# Patient Record
Sex: Male | Born: 1945
Health system: Southern US, Community
[De-identification: ages and names within clinical notes are randomized; demographics above are authoritative.]

## PROBLEM LIST (undated history)

## (undated) DIAGNOSIS — E785 Hyperlipidemia, unspecified: Secondary | ICD-10-CM

## (undated) DIAGNOSIS — T7840XA Allergy, unspecified, initial encounter: Secondary | ICD-10-CM

## (undated) DIAGNOSIS — N400 Enlarged prostate without lower urinary tract symptoms: Secondary | ICD-10-CM

## (undated) DIAGNOSIS — K219 Gastro-esophageal reflux disease without esophagitis: Secondary | ICD-10-CM

## (undated) DIAGNOSIS — F32A Depression, unspecified: Secondary | ICD-10-CM

## (undated) DIAGNOSIS — I10 Essential (primary) hypertension: Secondary | ICD-10-CM

## (undated) DIAGNOSIS — Z86010 Personal history of colonic polyps: Principal | ICD-10-CM

## (undated) HISTORY — PX: INGUINAL HERNIA REPAIR: SUR1180

## (undated) HISTORY — DX: Personal history of colonic polyps: Z86.010

## (undated) HISTORY — PX: ESOPHAGOGASTRODUODENOSCOPY: SHX1529

## (undated) HISTORY — DX: Benign prostatic hyperplasia without lower urinary tract symptoms: N40.0

## (undated) HISTORY — DX: Gastro-esophageal reflux disease without esophagitis: K21.9

## (undated) HISTORY — PX: UPPER GASTROINTESTINAL ENDOSCOPY: SHX188

## (undated) HISTORY — DX: Depression, unspecified: F32.A

## (undated) HISTORY — PX: COLONOSCOPY: SHX174

## (undated) HISTORY — DX: Hyperlipidemia, unspecified: E78.5

## (undated) HISTORY — DX: Allergy, unspecified, initial encounter: T78.40XA

---

## 2013-06-02 DIAGNOSIS — Z125 Encounter for screening for malignant neoplasm of prostate: Secondary | ICD-10-CM | POA: Diagnosis not present

## 2013-06-02 DIAGNOSIS — I10 Essential (primary) hypertension: Secondary | ICD-10-CM | POA: Diagnosis not present

## 2013-06-02 DIAGNOSIS — Z79899 Other long term (current) drug therapy: Secondary | ICD-10-CM | POA: Diagnosis not present

## 2013-06-02 DIAGNOSIS — E782 Mixed hyperlipidemia: Secondary | ICD-10-CM | POA: Diagnosis not present

## 2013-06-04 DIAGNOSIS — Z23 Encounter for immunization: Secondary | ICD-10-CM | POA: Diagnosis not present

## 2013-08-12 DIAGNOSIS — H2510 Age-related nuclear cataract, unspecified eye: Secondary | ICD-10-CM | POA: Diagnosis not present

## 2013-08-12 DIAGNOSIS — H25019 Cortical age-related cataract, unspecified eye: Secondary | ICD-10-CM | POA: Diagnosis not present

## 2013-09-21 DIAGNOSIS — I10 Essential (primary) hypertension: Secondary | ICD-10-CM | POA: Diagnosis not present

## 2013-09-21 DIAGNOSIS — J019 Acute sinusitis, unspecified: Secondary | ICD-10-CM | POA: Diagnosis not present

## 2013-11-11 DIAGNOSIS — I10 Essential (primary) hypertension: Secondary | ICD-10-CM | POA: Diagnosis not present

## 2013-11-11 DIAGNOSIS — E782 Mixed hyperlipidemia: Secondary | ICD-10-CM | POA: Diagnosis not present

## 2013-12-10 DIAGNOSIS — N400 Enlarged prostate without lower urinary tract symptoms: Secondary | ICD-10-CM | POA: Diagnosis not present

## 2014-06-20 DIAGNOSIS — Z23 Encounter for immunization: Secondary | ICD-10-CM | POA: Diagnosis not present

## 2014-06-22 DIAGNOSIS — H2510 Age-related nuclear cataract, unspecified eye: Secondary | ICD-10-CM | POA: Diagnosis not present

## 2014-07-22 ENCOUNTER — Encounter (HOSPITAL_COMMUNITY): Payer: Self-pay | Admitting: Emergency Medicine

## 2014-07-22 ENCOUNTER — Ambulatory Visit (HOSPITAL_COMMUNITY)
Admit: 2014-07-22 | Discharge: 2014-07-22 | Disposition: A | Discharge status: Home / Self Care | Payer: Medicare Other | Source: Ambulatory Visit | Attending: Family Medicine | Admitting: Family Medicine

## 2014-07-22 ENCOUNTER — Emergency Department (INDEPENDENT_AMBULATORY_CARE_PROVIDER_SITE_OTHER)
Admission: EM | Admit: 2014-07-22 | Discharge: 2014-07-22 | Disposition: A | Discharge status: Home / Self Care | Payer: Medicare Other | Source: Home / Self Care | Attending: Family Medicine | Admitting: Family Medicine

## 2014-07-22 DIAGNOSIS — R05 Cough: Secondary | ICD-10-CM | POA: Insufficient documentation

## 2014-07-22 DIAGNOSIS — J309 Allergic rhinitis, unspecified: Secondary | ICD-10-CM

## 2014-07-22 DIAGNOSIS — R0989 Other specified symptoms and signs involving the circulatory and respiratory systems: Secondary | ICD-10-CM | POA: Diagnosis not present

## 2014-07-22 DIAGNOSIS — R059 Cough, unspecified: Secondary | ICD-10-CM

## 2014-07-22 HISTORY — DX: Essential (primary) hypertension: I10

## 2014-07-22 MED ORDER — BENZONATATE 100 MG PO CAPS: 100.0000 mg | ORAL_CAPSULE | Freq: Three times a day (TID) | ORAL | Status: DC

## 2014-07-22 MED ORDER — CETIRIZINE HCL 10 MG PO TABS: 10.0000 mg | ORAL_TABLET | Freq: Every day | ORAL | Status: DC

## 2014-07-22 MED ORDER — OMEPRAZOLE 20 MG PO CPDR: 20.0000 mg | DELAYED_RELEASE_CAPSULE | Freq: Every day | ORAL | Status: DC

## 2014-07-22 MED ORDER — PREDNISONE 10 MG PO TABS: ORAL_TABLET | ORAL | Status: DC

## 2014-07-22 NOTE — ED Notes (Signed)
Pt is back from x-ray

## 2014-07-22 NOTE — ED Notes (Signed)
Patient transported to X-ray 

## 2014-07-22 NOTE — ED Provider Notes (Signed)
Medical screening examination/treatment/procedure(s) were performed by resident physician or non-physician practitioner and as supervising physician I was immediately available for consultation/collaboration.   Pauline Good MD.   Billy Fischer, MD 07/22/14 [phone]

## 2014-07-22 NOTE — ED Notes (Signed)
C/o cold sx onset 2-3 weeks Sx include: productive cough, congestion, SOB, wheezing; sx worsen at night Denies f/v/n/d, weakness, fatigue Has been taking Tussin w/no relief Alert, no signs of acute distress.

## 2014-07-22 NOTE — ED Provider Notes (Signed)
CSN: [id number]     Arrival date & time 07/22/14  1105 History   First MD Initiated Contact with Patient 07/22/14 1212     Chief Complaint  Patient presents with  . URI   (Consider location/radiation/quality/duration/timing/severity/associated sxs/prior Treatment) HPI Comments: Patient has recently relocated to [facility] from [place], [place] and is not schedule to see his new PCP for another 4-6 weeks.  PCP: Dr. Joylene Draft Non-smoker Denies fever, chills, dyspnea, weight loss, hemoptysis or night sweats.   Patient is a 68 y.o. male presenting with URI. The history is provided by the patient.  URI Presenting symptoms: congestion, cough and rhinorrhea   Presenting symptoms: no fever   Severity:  Moderate Onset quality:  Gradual Duration:  3 weeks Timing:  Constant Chronicity:  New Associated symptoms: no arthralgias, no headaches, no myalgias, no neck pain, no sinus pain, no sneezing, no swollen glands and no wheezing   Risk factors: no sick contacts     Past Medical History  Diagnosis Date  . Hypertension    History reviewed. No pertinent past surgical history. No family history on file. History  Substance Use Topics  . Smoking status: Never Smoker   . Smokeless tobacco: Not on file  . Alcohol Use: Yes    Review of Systems  Constitutional: Negative for fever.  HENT: Positive for congestion and rhinorrhea. Negative for sneezing.   Respiratory: Positive for cough. Negative for wheezing.   Musculoskeletal: Negative for arthralgias, myalgias and neck pain.  Neurological: Negative for headaches.  All other systems reviewed and are negative.   Allergies  Review of patient's allergies indicates no known allergies.  Home Medications   Prior to Admission medications   Medication Sig Start Date End Date Taking? Authorizing Provider  aspirin 81 MG tablet Take 81 mg by mouth daily.   Yes Historical Provider, MD  atorvastatin (LIPITOR) 20 MG tablet Take 20 mg by mouth daily.    Yes Historical Provider, MD  metoprolol tartrate (LOPRESSOR) 25 MG tablet Take 25 mg by mouth 2 (two) times daily.   Yes Historical Provider, MD  ramipril (ALTACE) 2.5 MG capsule Take 2.5 mg by mouth daily.   Yes Historical Provider, MD  benzonatate (TESSALON) 100 MG capsule Take 1 capsule (100 mg total) by mouth every 8 (eight) hours. 07/22/14   Audelia Hives Presson, PA  cetirizine (ZYRTEC) 10 MG tablet Take 1 tablet (10 mg total) by mouth daily. 07/22/14   Audelia Hives Presson, PA  predniSONE (DELTASONE) 10 MG tablet Take 4 tabs po QD day 1, 3 tabs po QD day 2, 2 tabs po QD day 3, and 1 tab po QD day 4 then stop 07/22/14   Annett Gula H Presson, PA   BP 135/75  Pulse 71  Temp(Src) 98.3 F (36.8 C) (Oral)  Resp 16  SpO2 100% Physical Exam  Nursing note and vitals reviewed. Constitutional: He is oriented to person, place, and time. He appears well-developed and well-nourished. No distress.  HENT:  Head: Normocephalic and atraumatic.  Right Ear: Hearing, tympanic membrane, external ear and ear canal normal.  Left Ear: Hearing, tympanic membrane, external ear and ear canal normal.  Nose: Nose normal.  Mouth/Throat: Uvula is midline, oropharynx is clear and moist and mucous membranes are normal.  Eyes: Conjunctivae are normal. No scleral icterus.  Neck: Normal range of motion. Neck supple.  Cardiovascular: Normal rate, regular rhythm and normal heart sounds.   Pulmonary/Chest: Effort normal and breath sounds normal. No respiratory distress. He  has no wheezes.  Abdominal: Soft. Bowel sounds are normal. He exhibits no distension. There is no tenderness.  Musculoskeletal: Normal range of motion.  Lymphadenopathy:    He has no cervical adenopathy.  Neurological: He is alert and oriented to person, place, and time.  Skin: Skin is warm and dry. No rash noted. No erythema.  Psychiatric: He has a normal mood and affect. His behavior is normal.    ED Course  Procedures (including  critical care time) Labs Review Labs Reviewed - No data to display  Imaging Review Dg Chest 2 View  07/22/2014   CLINICAL DATA:  Congestion, productive cough for 3 weeks  EXAM: CHEST  2 VIEW  COMPARISON:  None.  FINDINGS: No active infiltrate or effusion is seen. Mediastinal and hilar contours are unremarkable. The heart is within normal limits in size. No bony abnormality is seen.  IMPRESSION: No active cardiopulmonary disease.   Electronically Signed   By: Ivar Drape M.D.   On: 07/22/2014 14:12     MDM   1. Cough   2. Allergic rhinitis, unspecified allergic rhinitis type    CXR unremarkable. Patient does take daily ACEI and I question if this medication is at all responsible for his cough. Patient does indicate a history of environmental allergies and given associated symptoms of nasal congestion and rhinorrhea, may benefit from medication to help control these symptoms in hope of improving cough. Will Rx tessalon, short prednisone taper, and daily zyrtec and advise PCP follow up if no improvement.    Lutricia Feil, [place] 07/22/14 [phone]

## 2014-07-22 NOTE — Discharge Instructions (Signed)
Chest xray is normal. I suspect this is related to seasonal allergies. Please use medications as prescribed and follow up with Dr. Joylene Draft if no improvement.  Allergic Rhinitis Allergic rhinitis is when the mucous membranes in the nose respond to allergens. Allergens are particles in the air that cause your body to have an allergic reaction. This causes you to release allergic antibodies. Through a chain of events, these eventually cause you to release histamine into the blood stream. Although meant to protect the body, it is this release of histamine that causes your discomfort, such as frequent sneezing, congestion, and an itchy, runny nose.  CAUSES  Seasonal allergic rhinitis (hay fever) is caused by pollen allergens that may come from grasses, trees, and weeds. Year-round allergic rhinitis (perennial allergic rhinitis) is caused by allergens such as house dust mites, pet dander, and mold spores.  SYMPTOMS   Nasal stuffiness (congestion).  Itchy, runny nose with sneezing and tearing of the eyes. DIAGNOSIS  Your health care provider can help you determine the allergen or allergens that trigger your symptoms. If you and your health care provider are unable to determine the allergen, skin or blood testing may be used. TREATMENT  Allergic rhinitis does not have a cure, but it can be controlled by:  Medicines and allergy shots (immunotherapy).  Avoiding the allergen. Hay fever may often be treated with antihistamines in pill or nasal spray forms. Antihistamines block the effects of histamine. There are over-the-counter medicines that may help with nasal congestion and swelling around the eyes. Check with your health care provider before taking or giving this medicine.  If avoiding the allergen or the medicine prescribed do not work, there are many new medicines your health care provider can prescribe. Stronger medicine may be used if initial measures are ineffective. Desensitizing injections can be  used if medicine and avoidance does not work. Desensitization is when a patient is given ongoing shots until the body becomes less sensitive to the allergen. Make sure you follow up with your health care provider if problems continue. HOME CARE INSTRUCTIONS It is not possible to completely avoid allergens, but you can reduce your symptoms by taking steps to limit your exposure to them. It helps to know exactly what you are allergic to so that you can avoid your specific triggers. SEEK MEDICAL CARE IF:   You have a fever.  You develop a cough that does not stop easily (persistent).  You have shortness of breath.  You start wheezing.  Symptoms interfere with normal daily activities. Document Released: 06/18/2001 Document Revised: 09/28/2013 Document Reviewed: 05/31/2013 [facility] Patient Information 2015 [place], [place]. This information is not intended to replace advice given to you by your health care provider. Make sure you discuss any questions you have with your health care provider.  Cough, Adult  A cough is a reflex that helps clear your throat and airways. It can help heal the body or may be a reaction to an irritated airway. A cough may only last 2 or 3 weeks (acute) or may last more than 8 weeks (chronic).  CAUSES Acute cough:  Viral or bacterial infections. Chronic cough:  Infections.  Allergies.  Asthma.  Post-nasal drip.  Smoking.  Heartburn or acid reflux.  Some medicines.  Chronic lung problems (COPD).  Cancer. SYMPTOMS   Cough.  Fever.  Chest pain.  Increased breathing rate.  High-pitched whistling sound when breathing (wheezing).  Colored mucus that you cough up (sputum). TREATMENT   A bacterial cough may be  treated with antibiotic medicine.  A viral cough must run its course and will not respond to antibiotics.  Your caregiver may recommend other treatments if you have a chronic cough. HOME CARE INSTRUCTIONS   Only take over-the-counter  or prescription medicines for pain, discomfort, or fever as directed by your caregiver. Use cough suppressants only as directed by your caregiver.  Use a cold steam vaporizer or humidifier in your bedroom or home to help loosen secretions.  Sleep in a semi-upright position if your cough is worse at night.  Rest as needed.  Stop smoking if you smoke. SEEK IMMEDIATE MEDICAL CARE IF:   You have pus in your sputum.  Your cough starts to worsen.  You cannot control your cough with suppressants and are losing sleep.  You begin coughing up blood.  You have difficulty breathing.  You develop pain which is getting worse or is uncontrolled with medicine.  You have a fever. MAKE SURE YOU:   Understand these instructions.  Will watch your condition.  Will get help right away if you are not doing well or get worse. Document Released: 03/22/2011 Document Revised: 12/16/2011 Document Reviewed: 03/22/2011 [facility] Patient Information 2015 [place], [place]. This information is not intended to replace advice given to you by your health care provider. Make sure you discuss any questions you have with your health care provider.

## 2014-08-02 DIAGNOSIS — R05 Cough: Secondary | ICD-10-CM | POA: Diagnosis not present

## 2014-08-02 DIAGNOSIS — E785 Hyperlipidemia, unspecified: Secondary | ICD-10-CM | POA: Diagnosis not present

## 2014-08-02 DIAGNOSIS — D126 Benign neoplasm of colon, unspecified: Secondary | ICD-10-CM | POA: Diagnosis not present

## 2014-08-02 DIAGNOSIS — G43109 Migraine with aura, not intractable, without status migrainosus: Secondary | ICD-10-CM | POA: Diagnosis not present

## 2014-08-02 DIAGNOSIS — K219 Gastro-esophageal reflux disease without esophagitis: Secondary | ICD-10-CM | POA: Diagnosis not present

## 2014-08-02 DIAGNOSIS — Z23 Encounter for immunization: Secondary | ICD-10-CM | POA: Diagnosis not present

## 2014-08-02 DIAGNOSIS — I10 Essential (primary) hypertension: Secondary | ICD-10-CM | POA: Diagnosis not present

## 2014-08-02 DIAGNOSIS — L409 Psoriasis, unspecified: Secondary | ICD-10-CM | POA: Diagnosis not present

## 2014-08-02 DIAGNOSIS — Z1389 Encounter for screening for other disorder: Secondary | ICD-10-CM | POA: Diagnosis not present

## 2014-08-02 DIAGNOSIS — Z008 Encounter for other general examination: Secondary | ICD-10-CM | POA: Diagnosis not present

## 2014-08-25 DIAGNOSIS — Z008 Encounter for other general examination: Secondary | ICD-10-CM | POA: Diagnosis not present

## 2014-08-25 DIAGNOSIS — I10 Essential (primary) hypertension: Secondary | ICD-10-CM | POA: Diagnosis not present

## 2014-08-25 DIAGNOSIS — Z125 Encounter for screening for malignant neoplasm of prostate: Secondary | ICD-10-CM | POA: Diagnosis not present

## 2014-08-25 DIAGNOSIS — E785 Hyperlipidemia, unspecified: Secondary | ICD-10-CM | POA: Diagnosis not present

## 2014-10-03 DIAGNOSIS — N401 Enlarged prostate with lower urinary tract symptoms: Secondary | ICD-10-CM | POA: Diagnosis not present

## 2014-10-31 ENCOUNTER — Ambulatory Visit (HOSPITAL_COMMUNITY)
Admission: RE | Admit: 2014-10-31 | Discharge: 2014-10-31 | Disposition: A | Discharge status: Home / Self Care | Payer: Medicare Other | Source: Ambulatory Visit | Attending: Surgery | Admitting: Surgery

## 2014-10-31 ENCOUNTER — Other Ambulatory Visit (HOSPITAL_COMMUNITY): Payer: Self-pay | Admitting: Internal Medicine

## 2014-10-31 DIAGNOSIS — M25561 Pain in right knee: Secondary | ICD-10-CM | POA: Diagnosis present

## 2014-10-31 DIAGNOSIS — Z6826 Body mass index (BMI) 26.0-26.9, adult: Secondary | ICD-10-CM | POA: Diagnosis not present

## 2014-12-15 DIAGNOSIS — Z6826 Body mass index (BMI) 26.0-26.9, adult: Secondary | ICD-10-CM | POA: Diagnosis not present

## 2014-12-15 DIAGNOSIS — R509 Fever, unspecified: Secondary | ICD-10-CM | POA: Diagnosis not present

## 2014-12-15 DIAGNOSIS — R05 Cough: Secondary | ICD-10-CM | POA: Diagnosis not present

## 2014-12-15 DIAGNOSIS — J09X2 Influenza due to identified novel influenza A virus with other respiratory manifestations: Secondary | ICD-10-CM | POA: Diagnosis not present

## 2015-01-02 DIAGNOSIS — N400 Enlarged prostate without lower urinary tract symptoms: Secondary | ICD-10-CM | POA: Diagnosis not present

## 2015-01-02 DIAGNOSIS — R972 Elevated prostate specific antigen [PSA]: Secondary | ICD-10-CM | POA: Diagnosis not present

## 2015-01-03 DIAGNOSIS — N400 Enlarged prostate without lower urinary tract symptoms: Secondary | ICD-10-CM | POA: Diagnosis not present

## 2015-01-03 DIAGNOSIS — R972 Elevated prostate specific antigen [PSA]: Secondary | ICD-10-CM | POA: Diagnosis not present

## 2015-01-04 DIAGNOSIS — R972 Elevated prostate specific antigen [PSA]: Secondary | ICD-10-CM | POA: Diagnosis not present

## 2015-01-04 DIAGNOSIS — N400 Enlarged prostate without lower urinary tract symptoms: Secondary | ICD-10-CM | POA: Diagnosis not present

## 2015-02-01 ENCOUNTER — Encounter: Payer: Self-pay | Admitting: Internal Medicine

## 2015-03-13 ENCOUNTER — Telehealth: Payer: Self-pay | Admitting: Internal Medicine

## 2015-03-13 NOTE — Telephone Encounter (Signed)
Received records from Dr. [facility] forwarded to Dr.Gessner 03/13/15 fbg.

## 2015-03-22 ENCOUNTER — Encounter: Payer: Self-pay | Admitting: Internal Medicine

## 2015-03-22 ENCOUNTER — Ambulatory Visit (INDEPENDENT_AMBULATORY_CARE_PROVIDER_SITE_OTHER): Payer: Medicare Other | Admitting: Internal Medicine

## 2015-03-22 ENCOUNTER — Encounter (INDEPENDENT_AMBULATORY_CARE_PROVIDER_SITE_OTHER): Payer: Self-pay

## 2015-03-22 VITALS — BP 138/76 | HR 84 | Ht 65.75 in | Wt 162.5 lb

## 2015-03-22 DIAGNOSIS — R1314 Dysphagia, pharyngoesophageal phase: Secondary | ICD-10-CM | POA: Diagnosis not present

## 2015-03-22 DIAGNOSIS — K222 Esophageal obstruction: Secondary | ICD-10-CM | POA: Diagnosis not present

## 2015-03-22 DIAGNOSIS — R1319 Other dysphagia: Secondary | ICD-10-CM

## 2015-03-22 DIAGNOSIS — Z86010 Personal history of colonic polyps: Secondary | ICD-10-CM | POA: Diagnosis not present

## 2015-03-22 DIAGNOSIS — R1310 Dysphagia, unspecified: Secondary | ICD-10-CM

## 2015-03-22 NOTE — Progress Notes (Signed)
Referred by: Crist Infante, MD [place], [place] [id number]  Subjective:    Patient ID: Colin Garrett, male    DOB: May 28, 1946, 69 y.o.   MRN: [id number] Cc: dysphagia and hx colon polyps HPI This is a 69 yo wm with a hx of colon polyps and esophageal ring. He is having intermittent solid dysphagia. Has used PPI in past but not now and says only rare heartburn. He had an EGD with dilation in 2002 and EGD 2010 no dilation.  Colonoscopy 2008 3 small polyps removed ascending x 1 and 2 left side, diverticulosis and hemorrhoids. No pathology but says he was told the polyps were precancerous.  He says he gets an occasional twinge of chest pain. Hx some intermittent rectal bleeding. GI ROS o/w negative.  No Known Allergies Outpatient Prescriptions Prior to Visit  Medication Sig Dispense Refill  . aspirin 81 MG tablet Take 81 mg by mouth daily.    . cetirizine (ZYRTEC) 10 MG tablet Take 1 tablet (10 mg total) by mouth daily. 30 tablet 0  . ramipril (ALTACE) 2.5 MG capsule Take 2.5 mg by mouth daily.    Marland Kitchen atorvastatin (LIPITOR) 20 MG tablet Take 20 mg by mouth daily.    . benzonatate (TESSALON) 100 MG capsule Take 1 capsule (100 mg total) by mouth every 8 (eight) hours. 21 capsule 0  . metoprolol tartrate (LOPRESSOR) 25 MG tablet Take 25 mg by mouth 2 (two) times daily.    Marland Kitchen omeprazole (PRILOSEC) 20 MG capsule Take 1 capsule (20 mg total) by mouth daily. 30 capsule 0  . predniSONE (DELTASONE) 10 MG tablet Take 4 tabs po QD day 1, 3 tabs po QD day 2, 2 tabs po QD day 3, and 1 tab po QD day 4 then stop 10 tablet 0   No facility-administered medications prior to visit.   Past Medical History  Diagnosis Date  . Hypertension   . Hyperlipidemia   . Hyperplasia of prostate    Past Surgical History  Procedure Laterality Date  . Inguinal hernia repair Left   . [place]og[place]oscopy      in [place]  . [place]oscopy      in [place]   History   Social History  . Marital Status: Married   Spouse Name: N/A  . Number of Children: 4  . Years of Education: N/A   Occupational History  . management    Social History Main Topics  . Smoking status: Former Smoker -- 1.00 packs/day for 30 years    Types: Cigars, Cigarettes    Quit date: 10/07/2004  . Smokeless tobacco: Never Used  . Alcohol Use: 0.0 oz/week    0 Standard drinks or equivalent per week     Comment: Pt drinks 1-2 drinks a day  . Drug Use: No  . Sexual Activity: Not on file   Other Topics Concern  . None   Social History Narrative   Married 2 sons 2 daughters   Arts development officer, Chief Strategy Officer   Originally from [place], in [place] since 2013-4, prior to that split time in [place] and [place]   1 caffeine drink daily   03/23/2015      Family History  Problem Relation Age of Onset  . Prostate cancer Brother 91  . Heart disease Mother     Died at age 57  . Heart disease Father     Died at age 33  . Colon cancer Neg Hx   . Colon polyps Neg Hx   .  Esophageal cancer Neg Hx   . Gallbladder disease Neg Hx   . Kidney disease Neg Hx     Review of Systems All other ROS negative or as in HPI    Objective:   Physical Exam @BP  138/76 mmHg  Pulse 84  Ht 5' 5.75" (1.67 m)  Wt 162 lb 8 oz (73.71 kg)  BMI 26.43 kg/m2@  General:  Well-developed, well-nourished and in no acute distress Eyes:  anicteric. Lungs: Clear to auscultation bilaterally. Heart:  S1S2, no rubs, murmurs, gallops. Abdomen:  soft, non-tender, no hepatosplenomegaly, hernia, or mass and BS+.  Rectal: deferred Extremities:   cyanosis or clubbing Neuro:  A&O x 3.  Psych:  appropriate mood and  Affect.   Data Reviewed: Prior EGD, colonoscopy reports and photos     Assessment & Plan:  Esophageal dysphagia  Esophageal ring, acquired  Hx of colonic polyps  1. Schedule EGD and dilation 2. Schedule colonoscopy due to hx polyps 3. Likely to need chronic PPI The risks and benefits as well as alternatives of endoscopic  procedure(s) have been discussed and reviewed. All questions answered. The patient agrees to proceed.  I appreciate the opportunity to care for this patient. HY:IFOYDX,AJOI A, MD

## 2015-03-22 NOTE — Patient Instructions (Signed)
You have been scheduled for a colonoscopy. Please follow written instructions given to you at your visit today.  Please pick up your prep supplies at the pharmacy within the next 1-3 days. If you use inhalers (even only as needed), please bring them with you on the day of your procedure.   I appreciate the opportunity to care for you. Carl Gessner, MD, FACG 

## 2015-03-23 ENCOUNTER — Encounter: Payer: Self-pay | Admitting: Internal Medicine

## 2015-04-07 ENCOUNTER — Telehealth: Payer: Self-pay | Admitting: Internal Medicine

## 2015-04-07 NOTE — Telephone Encounter (Signed)
Rec'd from [facility] forward 33 pages to Dr. Carlean Purl

## 2015-04-11 ENCOUNTER — Telehealth: Payer: Self-pay | Admitting: Internal Medicine

## 2015-04-11 NOTE — Telephone Encounter (Signed)
Received records from [facility] forwarded to Dr. Carlean Purl 04/11/15 fbg.

## 2015-05-04 ENCOUNTER — Encounter: Payer: Self-pay | Admitting: Internal Medicine

## 2015-05-04 ENCOUNTER — Ambulatory Visit (AMBULATORY_SURGERY_CENTER): Payer: Medicare Other | Admitting: Internal Medicine

## 2015-05-04 VITALS — BP 98/63 | HR 68 | Temp 97.1°F | Resp 14 | Ht 65.0 in | Wt 162.0 lb

## 2015-05-04 DIAGNOSIS — I10 Essential (primary) hypertension: Secondary | ICD-10-CM | POA: Diagnosis not present

## 2015-05-04 DIAGNOSIS — R1319 Other dysphagia: Secondary | ICD-10-CM

## 2015-05-04 DIAGNOSIS — Z86010 Personal history of colonic polyps: Secondary | ICD-10-CM | POA: Diagnosis not present

## 2015-05-04 DIAGNOSIS — D175 Benign lipomatous neoplasm of intra-abdominal organs: Secondary | ICD-10-CM | POA: Diagnosis not present

## 2015-05-04 DIAGNOSIS — R1310 Dysphagia, unspecified: Secondary | ICD-10-CM

## 2015-05-04 DIAGNOSIS — K222 Esophageal obstruction: Secondary | ICD-10-CM

## 2015-05-04 DIAGNOSIS — D122 Benign neoplasm of ascending colon: Secondary | ICD-10-CM

## 2015-05-04 DIAGNOSIS — R1314 Dysphagia, pharyngoesophageal phase: Secondary | ICD-10-CM | POA: Diagnosis not present

## 2015-05-04 DIAGNOSIS — D124 Benign neoplasm of descending colon: Secondary | ICD-10-CM

## 2015-05-04 DIAGNOSIS — K635 Polyp of colon: Secondary | ICD-10-CM

## 2015-05-04 DIAGNOSIS — E785 Hyperlipidemia, unspecified: Secondary | ICD-10-CM | POA: Diagnosis not present

## 2015-05-04 MED ORDER — SODIUM CHLORIDE 0.9 % IV SOLN: 500.0000 mL | INTRAVENOUS | Status: DC

## 2015-05-04 MED ORDER — OMEPRAZOLE 20 MG PO CPDR: 20.0000 mg | DELAYED_RELEASE_CAPSULE | Freq: Every day | ORAL | Status: DC

## 2015-05-04 NOTE — Op Note (Signed)
[place]  [address] [place], [id number]   ENDOSCOPY PROCEDURE REPORT  PATIENT: Colin Garrett, Colin Garrett  MR#: [id number] BIRTHDATE: 12-Feb-1946 , 69  yrs. old GENDER: male ENDOSCOPIST: Gatha Mayer, MD, [facility] PROCEDURE DATE:  05/04/2015 PROCEDURE:  EGD w/ balloon dilation ASA CLASS:     Class II INDICATIONS:  dysphagia. MEDICATIONS: Propofol 200 mg IV and Monitored anesthesia care TOPICAL ANESTHETIC: none  DESCRIPTION OF PROCEDURE: After the risks benefits and alternatives of the procedure were thoroughly explained, informed consent was obtained.  The LB [id number] endoscope was introduced through the mouth and advanced to the second portion of the duodenum , Without limitations.  The instrument was slowly withdrawn as the mucosa was fully examined.    1) ring-like stricture at GE junction - dilated to 18 mm w/ balloon. 2) 2-3 cm hiatal hernia 3) Otherwise normal EGD.  Retroflexed views revealed as previously described.     The scope was then withdrawn from the patient and the procedure completed.  COMPLICATIONS: There were no immediate complications.  ENDOSCOPIC IMPRESSION: 1) ring-like stricture at GE junction - dilated to 18 mm w/ balloon. 2) 2-3 cm hiatal hernia 3) Otherwise normal EGD  RECOMMENDATIONS: 1.  Clear liquids until 430 PM, then soft foods rest of day.  Resume prior diet tomorrow. 2.  PPI qam  - omeprazole 20 mg Rx - take chronically to reduce need for repeat dilation   eSigned:  Gatha Mayer, MD, [facility] 05/04/2015 3:23 PM    CC:The Patient and Crist Infante, MD

## 2015-05-04 NOTE — Op Note (Signed)
[place]  [address] [place], [address]   COLONOSCOPY PROCEDURE REPORT  PATIENT: Colin Garrett, Colin Garrett  MR#: [id number] BIRTHDATE: 08-20-1946 , 69  yrs. old GENDER: male ENDOSCOPIST: Gatha Mayer, MD, [facility] PROCEDURE DATE:  05/04/2015 PROCEDURE:   Colonoscopy, surveillance and Colonoscopy with biopsy First Screening Colonoscopy - Avg.  risk and is 50 yrs.  old or older - No.  Prior Negative Screening - Now for repeat screening. N/A  History of Adenoma - Now for follow-up colonoscopy & has been > or = to 3 yrs.  Yes hx of adenoma.  Has been 3 or more years since last colonoscopy.  Polyps removed today? Yes ASA CLASS:   Class II INDICATIONS:Surveillance due to prior colonic neoplasia and PH Colon Adenoma. MEDICATIONS: Propofol 140 mg IV and Monitored anesthesia care  DESCRIPTION OF PROCEDURE:   After the risks benefits and alternatives of the procedure were thoroughly explained, informed consent was obtained.  The digital rectal exam revealed no abnormalities of the rectum, revealed no prostatic nodules, and revealed the prostate was not enlarged.   The LB [id number] endoscope was introduced through the anus and advanced to the cecum, which was identified by both the appendix and ileocecal valve. No adverse events experienced.   The quality of the prep was good.  (MiraLax was used)  The instrument was then slowly withdrawn as the colon was fully examined. Estimated blood loss is zero unless otherwise noted in this procedure report.  COLON FINDINGS: Two smooth sessile polyps ranging from 2 to 54mm in size were found in the ascending colon and descending colon. Polypectomies were performed with cold forceps.  The resection was complete, the polyp tissue was completely retrieved and sent to histology.   There was diverticulosis noted in the sigmoid colon. The examination was otherwise normal.   Right colon retroflexion included.  Retroflexed views revealed no  abnormalities. The time to cecum = 2.1 Withdrawal time = 10.4   The scope was withdrawn and the procedure completed. COMPLICATIONS: There were no immediate complications.  ENDOSCOPIC IMPRESSION: 1.   Two sessile polyps ranging from 2 to 91mm in size were found in the ascending colon and descending colon; polypectomies were performed with cold forceps 2.   Diverticulosis was noted in the sigmoid colon 3.   The examination was otherwise normal  RECOMMENDATIONS: Timing of repeat colonoscopy will be determined by pathology findings.  Hx of diminutive polyps removed in past  eSigned:  Gatha Mayer, MD, [facility] 05/04/2015 3:27 PM   cc: Crist Infante, MD and The Patient

## 2015-05-04 NOTE — Progress Notes (Signed)
Called to room to assist during endoscopic procedure.  Patient ID and intended procedure confirmed with present staff. Received instructions for my participation in the procedure from the performing physician.  

## 2015-05-04 NOTE — Progress Notes (Signed)
Report to PACU, RN, vss, BBS= Clear.  

## 2015-05-04 NOTE — Patient Instructions (Addendum)
I found a stricture in the esophagus and dilated it to treat your swallowing problems. This is usually caused by acid reflux even if you do not have heartburn so i have prescribed omeprazole to treat that and reduce chance of recurrent stricture formation.  I found and removed 2 small polyps from the colon - they look benign. You also have a condition called diverticulosis - common and not usually a problem. Please read the handout provided.  I will let you know pathology results and when to have another routine colonoscopy by mail.  I appreciate the opportunity to care for you. Gatha Mayer, MD, FACG YOU HAD AN ENDOSCOPIC PROCEDURE TODAY AT [facility]:   Refer to the procedure report that was given to you for any specific questions about what was found during the examination.  If the procedure report does not answer your questions, please call your gastroenterologist to clarify.  If you requested that your care partner not be given the details of your procedure findings, then the procedure report has been included in a sealed envelope for you to review at your convenience later.  YOU SHOULD EXPECT: Some feelings of bloating in the abdomen. Passage of more gas than usual.  Walking can help get rid of the air that was put into your GI tract during the procedure and reduce the bloating. If you had a lower endoscopy (such as a colonoscopy or flexible sigmoidoscopy) you may notice spotting of blood in your stool or on the toilet paper. If you underwent a bowel prep for your procedure, you may not have a normal bowel movement for a few days.  Please Note:  You might notice some irritation and congestion in your nose or some drainage.  This is from the oxygen used during your procedure.  There is no need for concern and it should clear up in a day or so.  SYMPTOMS TO REPORT IMMEDIATELY:   Following lower endoscopy (colonoscopy or flexible sigmoidoscopy):  Excessive amounts  of blood in the stool  Significant tenderness or worsening of abdominal pains  Swelling of the abdomen that is new, acute  Fever of 100F or higher   Following upper endoscopy (EGD)  Vomiting of blood or coffee ground material  New chest pain or pain under the shoulder blades  Painful or persistently difficult swallowing  New shortness of breath  Fever of 100F or higher  Black, tarry-looking stools  For urgent or emergent issues, a gastroenterologist can be reached at any hour by calling [phone].   DIET: You may have clear liquids until 4:30pm.  Then you may have a soft diet until tomorrow.  Then, you may have a regular diet.  Likewise, meals heavy in dairy and vegetables can increase bloating.  Drink plenty of fluids but you should avoid alcoholic beverages for 24 hours.  ACTIVITY:  You should plan to take it easy for the rest of today and you should NOT DRIVE or use heavy machinery until tomorrow (because of the sedation medicines used during the test).    FOLLOW UP: Our staff will call the number listed on your records the next business day following your procedure to check on you and address any questions or concerns that you may have regarding the information given to you following your procedure. If we do not reach you, we will leave a message.  However, if you are feeling well and you are not experiencing any problems, there is no need to  return our call.  We will assume that you have returned to your regular daily activities without incident.  If any biopsies were taken you will be contacted by phone or by letter within the next 1-3 weeks.  Please call us at [phone] if you have not heard about the biopsies in 3 weeks.    SIGNATURES/CONFIDENTIALITY: You and/or your care partner have signed paperwork which will be entered into your electronic medical record.  These signatures attest to the fact that that the information above on your After Visit Summary has been  reviewed and is understood.  Full responsibility of the confidentiality of this discharge information lies with you and/or your care-partner.  Read all of the handouts given to you by your recovery room nurse.

## 2015-05-05 ENCOUNTER — Telehealth: Payer: Self-pay

## 2015-05-05 NOTE — Telephone Encounter (Signed)
  Follow up Call-  Call back number 05/04/2015  Post procedure Call Back phone  # [phone]  Permission to leave phone message Yes     Patient questions:  Do you have a fever, pain , or abdominal swelling? No. Pain Score  0 *  Have you tolerated food without any problems? Yes.    Have you been able to return to your normal activities? Yes.    Do you have any questions about your discharge instructions: Diet   No. Medications  No. Follow up visit  No.  Do you have questions or concerns about your Care? No.  Actions: * If pain score is 4 or above: No action needed, pain <4.

## 2015-05-15 ENCOUNTER — Encounter: Payer: Self-pay | Admitting: Internal Medicine

## 2015-05-15 DIAGNOSIS — Z860101 Personal history of adenomatous and serrated colon polyps: Secondary | ICD-10-CM

## 2015-05-15 DIAGNOSIS — Z86010 Personal history of colonic polyps: Secondary | ICD-10-CM

## 2015-05-15 HISTORY — DX: Personal history of adenomatous and serrated colon polyps: Z86.0101

## 2015-05-15 HISTORY — DX: Personal history of colonic polyps: Z86.010

## 2015-05-15 NOTE — Progress Notes (Signed)
Quick Note:  Diminutive adenoma and a diminutive lipoma repeat colonoscopy 5 years 2021 ______

## 2015-06-29 DIAGNOSIS — H2513 Age-related nuclear cataract, bilateral: Secondary | ICD-10-CM | POA: Diagnosis not present

## 2015-07-15 DIAGNOSIS — Z23 Encounter for immunization: Secondary | ICD-10-CM | POA: Diagnosis not present

## 2015-09-18 DIAGNOSIS — Z125 Encounter for screening for malignant neoplasm of prostate: Secondary | ICD-10-CM | POA: Diagnosis not present

## 2015-09-18 DIAGNOSIS — I10 Essential (primary) hypertension: Secondary | ICD-10-CM | POA: Diagnosis not present

## 2015-09-18 DIAGNOSIS — E784 Other hyperlipidemia: Secondary | ICD-10-CM | POA: Diagnosis not present

## 2015-09-25 DIAGNOSIS — I10 Essential (primary) hypertension: Secondary | ICD-10-CM | POA: Diagnosis not present

## 2015-09-25 DIAGNOSIS — D126 Benign neoplasm of colon, unspecified: Secondary | ICD-10-CM | POA: Diagnosis not present

## 2015-09-25 DIAGNOSIS — G43109 Migraine with aura, not intractable, without status migrainosus: Secondary | ICD-10-CM | POA: Diagnosis not present

## 2015-09-25 DIAGNOSIS — E784 Other hyperlipidemia: Secondary | ICD-10-CM | POA: Diagnosis not present

## 2015-09-25 DIAGNOSIS — Z6827 Body mass index (BMI) 27.0-27.9, adult: Secondary | ICD-10-CM | POA: Diagnosis not present

## 2015-09-25 DIAGNOSIS — Z0000 Encounter for general adult medical examination without abnormal findings: Secondary | ICD-10-CM | POA: Diagnosis not present

## 2015-09-25 DIAGNOSIS — N401 Enlarged prostate with lower urinary tract symptoms: Secondary | ICD-10-CM | POA: Diagnosis not present

## 2015-09-25 DIAGNOSIS — M25561 Pain in right knee: Secondary | ICD-10-CM | POA: Diagnosis not present

## 2015-09-25 DIAGNOSIS — Z1389 Encounter for screening for other disorder: Secondary | ICD-10-CM | POA: Diagnosis not present

## 2015-09-25 DIAGNOSIS — K219 Gastro-esophageal reflux disease without esophagitis: Secondary | ICD-10-CM | POA: Diagnosis not present

## 2015-09-25 DIAGNOSIS — R6882 Decreased libido: Secondary | ICD-10-CM | POA: Diagnosis not present

## 2015-09-25 DIAGNOSIS — E291 Testicular hypofunction: Secondary | ICD-10-CM | POA: Diagnosis not present

## 2015-09-25 DIAGNOSIS — L409 Psoriasis, unspecified: Secondary | ICD-10-CM | POA: Diagnosis not present

## 2015-12-13 DIAGNOSIS — L400 Psoriasis vulgaris: Secondary | ICD-10-CM | POA: Diagnosis not present

## 2016-01-01 DIAGNOSIS — R52 Pain, unspecified: Secondary | ICD-10-CM | POA: Diagnosis not present

## 2016-01-01 DIAGNOSIS — J209 Acute bronchitis, unspecified: Secondary | ICD-10-CM | POA: Diagnosis not present

## 2016-01-01 DIAGNOSIS — Z6827 Body mass index (BMI) 27.0-27.9, adult: Secondary | ICD-10-CM | POA: Diagnosis not present

## 2016-01-01 DIAGNOSIS — R509 Fever, unspecified: Secondary | ICD-10-CM | POA: Diagnosis not present

## 2016-01-01 DIAGNOSIS — K219 Gastro-esophageal reflux disease without esophagitis: Secondary | ICD-10-CM | POA: Diagnosis not present

## 2016-01-01 DIAGNOSIS — I10 Essential (primary) hypertension: Secondary | ICD-10-CM | POA: Diagnosis not present

## 2016-01-01 DIAGNOSIS — R05 Cough: Secondary | ICD-10-CM | POA: Diagnosis not present

## 2016-03-11 DIAGNOSIS — N401 Enlarged prostate with lower urinary tract symptoms: Secondary | ICD-10-CM | POA: Diagnosis not present

## 2016-03-11 DIAGNOSIS — R6882 Decreased libido: Secondary | ICD-10-CM | POA: Diagnosis not present

## 2016-04-15 ENCOUNTER — Other Ambulatory Visit: Payer: Self-pay | Admitting: Internal Medicine

## 2016-06-20 DIAGNOSIS — Z0100 Encounter for examination of eyes and vision without abnormal findings: Secondary | ICD-10-CM | POA: Diagnosis not present

## 2016-06-20 DIAGNOSIS — H25013 Cortical age-related cataract, bilateral: Secondary | ICD-10-CM | POA: Diagnosis not present

## 2016-06-22 DIAGNOSIS — Z23 Encounter for immunization: Secondary | ICD-10-CM | POA: Diagnosis not present

## 2016-07-19 ENCOUNTER — Other Ambulatory Visit: Payer: Self-pay | Admitting: Internal Medicine

## 2016-09-17 DIAGNOSIS — N401 Enlarged prostate with lower urinary tract symptoms: Secondary | ICD-10-CM | POA: Diagnosis not present

## 2016-09-19 DIAGNOSIS — M47816 Spondylosis without myelopathy or radiculopathy, lumbar region: Secondary | ICD-10-CM | POA: Diagnosis not present

## 2016-09-19 DIAGNOSIS — M5416 Radiculopathy, lumbar region: Secondary | ICD-10-CM | POA: Diagnosis not present

## 2016-09-19 DIAGNOSIS — I10 Essential (primary) hypertension: Secondary | ICD-10-CM | POA: Diagnosis not present

## 2016-09-19 DIAGNOSIS — Z6828 Body mass index (BMI) 28.0-28.9, adult: Secondary | ICD-10-CM | POA: Diagnosis not present

## 2016-10-08 DIAGNOSIS — M9905 Segmental and somatic dysfunction of pelvic region: Secondary | ICD-10-CM | POA: Diagnosis not present

## 2016-10-08 DIAGNOSIS — M5116 Intervertebral disc disorders with radiculopathy, lumbar region: Secondary | ICD-10-CM | POA: Diagnosis not present

## 2016-10-08 DIAGNOSIS — M9904 Segmental and somatic dysfunction of sacral region: Secondary | ICD-10-CM | POA: Diagnosis not present

## 2016-10-08 DIAGNOSIS — M461 Sacroiliitis, not elsewhere classified: Secondary | ICD-10-CM | POA: Diagnosis not present

## 2016-10-08 DIAGNOSIS — M9903 Segmental and somatic dysfunction of lumbar region: Secondary | ICD-10-CM | POA: Diagnosis not present

## 2016-10-14 DIAGNOSIS — Z125 Encounter for screening for malignant neoplasm of prostate: Secondary | ICD-10-CM | POA: Diagnosis not present

## 2016-10-14 DIAGNOSIS — I10 Essential (primary) hypertension: Secondary | ICD-10-CM | POA: Diagnosis not present

## 2016-10-14 DIAGNOSIS — E784 Other hyperlipidemia: Secondary | ICD-10-CM | POA: Diagnosis not present

## 2016-10-22 ENCOUNTER — Telehealth: Payer: Self-pay | Admitting: Internal Medicine

## 2016-10-22 DIAGNOSIS — N401 Enlarged prostate with lower urinary tract symptoms: Secondary | ICD-10-CM | POA: Diagnosis not present

## 2016-10-22 DIAGNOSIS — D126 Benign neoplasm of colon, unspecified: Secondary | ICD-10-CM | POA: Diagnosis not present

## 2016-10-22 DIAGNOSIS — E784 Other hyperlipidemia: Secondary | ICD-10-CM | POA: Diagnosis not present

## 2016-10-22 DIAGNOSIS — I10 Essential (primary) hypertension: Secondary | ICD-10-CM | POA: Diagnosis not present

## 2016-10-22 DIAGNOSIS — R6882 Decreased libido: Secondary | ICD-10-CM | POA: Diagnosis not present

## 2016-10-22 DIAGNOSIS — G43109 Migraine with aura, not intractable, without status migrainosus: Secondary | ICD-10-CM | POA: Diagnosis not present

## 2016-10-22 DIAGNOSIS — Z1389 Encounter for screening for other disorder: Secondary | ICD-10-CM | POA: Diagnosis not present

## 2016-10-22 DIAGNOSIS — M25561 Pain in right knee: Secondary | ICD-10-CM | POA: Diagnosis not present

## 2016-10-22 DIAGNOSIS — Z6828 Body mass index (BMI) 28.0-28.9, adult: Secondary | ICD-10-CM | POA: Diagnosis not present

## 2016-10-22 DIAGNOSIS — Z0000 Encounter for general adult medical examination without abnormal findings: Secondary | ICD-10-CM | POA: Diagnosis not present

## 2016-10-22 DIAGNOSIS — L408 Other psoriasis: Secondary | ICD-10-CM | POA: Diagnosis not present

## 2016-10-22 DIAGNOSIS — M5416 Radiculopathy, lumbar region: Secondary | ICD-10-CM | POA: Diagnosis not present

## 2016-10-22 MED ORDER — OMEPRAZOLE 20 MG PO CPDR: DELAYED_RELEASE_CAPSULE | ORAL | 0 refills | Status: DC

## 2016-10-22 NOTE — Telephone Encounter (Signed)
Sent in omeprazole as patient requested to [facility].

## 2016-10-24 DIAGNOSIS — Z1212 Encounter for screening for malignant neoplasm of rectum: Secondary | ICD-10-CM | POA: Diagnosis not present

## 2017-04-17 DIAGNOSIS — R972 Elevated prostate specific antigen [PSA]: Secondary | ICD-10-CM | POA: Diagnosis not present

## 2017-06-24 DIAGNOSIS — H5203 Hypermetropia, bilateral: Secondary | ICD-10-CM | POA: Diagnosis not present

## 2017-06-24 DIAGNOSIS — H25013 Cortical age-related cataract, bilateral: Secondary | ICD-10-CM | POA: Diagnosis not present

## 2017-07-05 DIAGNOSIS — Z23 Encounter for immunization: Secondary | ICD-10-CM | POA: Diagnosis not present

## 2017-07-22 DIAGNOSIS — N401 Enlarged prostate with lower urinary tract symptoms: Secondary | ICD-10-CM | POA: Diagnosis not present

## 2017-11-03 DIAGNOSIS — I10 Essential (primary) hypertension: Secondary | ICD-10-CM | POA: Diagnosis not present

## 2017-11-03 DIAGNOSIS — Z125 Encounter for screening for malignant neoplasm of prostate: Secondary | ICD-10-CM | POA: Diagnosis not present

## 2017-11-03 DIAGNOSIS — R82998 Other abnormal findings in urine: Secondary | ICD-10-CM | POA: Diagnosis not present

## 2017-11-03 DIAGNOSIS — E7849 Other hyperlipidemia: Secondary | ICD-10-CM | POA: Diagnosis not present

## 2017-11-10 DIAGNOSIS — I10 Essential (primary) hypertension: Secondary | ICD-10-CM | POA: Diagnosis not present

## 2017-11-10 DIAGNOSIS — Z1389 Encounter for screening for other disorder: Secondary | ICD-10-CM | POA: Diagnosis not present

## 2017-11-10 DIAGNOSIS — D126 Benign neoplasm of colon, unspecified: Secondary | ICD-10-CM | POA: Diagnosis not present

## 2017-11-10 DIAGNOSIS — E7849 Other hyperlipidemia: Secondary | ICD-10-CM | POA: Diagnosis not present

## 2017-11-10 DIAGNOSIS — N401 Enlarged prostate with lower urinary tract symptoms: Secondary | ICD-10-CM | POA: Diagnosis not present

## 2017-11-10 DIAGNOSIS — Z0000 Encounter for general adult medical examination without abnormal findings: Secondary | ICD-10-CM | POA: Diagnosis not present

## 2017-11-10 DIAGNOSIS — Z6823 Body mass index (BMI) 23.0-23.9, adult: Secondary | ICD-10-CM | POA: Diagnosis not present

## 2017-11-10 DIAGNOSIS — K219 Gastro-esophageal reflux disease without esophagitis: Secondary | ICD-10-CM | POA: Diagnosis not present

## 2017-11-10 DIAGNOSIS — M25561 Pain in right knee: Secondary | ICD-10-CM | POA: Diagnosis not present

## 2017-11-10 DIAGNOSIS — R6882 Decreased libido: Secondary | ICD-10-CM | POA: Diagnosis not present

## 2017-11-10 DIAGNOSIS — G43109 Migraine with aura, not intractable, without status migrainosus: Secondary | ICD-10-CM | POA: Diagnosis not present

## 2017-11-10 DIAGNOSIS — M5416 Radiculopathy, lumbar region: Secondary | ICD-10-CM | POA: Diagnosis not present

## 2017-11-12 DIAGNOSIS — Z1212 Encounter for screening for malignant neoplasm of rectum: Secondary | ICD-10-CM | POA: Diagnosis not present

## 2018-03-04 DIAGNOSIS — E7849 Other hyperlipidemia: Secondary | ICD-10-CM | POA: Diagnosis not present

## 2018-05-06 DIAGNOSIS — R351 Nocturia: Secondary | ICD-10-CM | POA: Diagnosis not present

## 2018-05-06 DIAGNOSIS — R972 Elevated prostate specific antigen [PSA]: Secondary | ICD-10-CM | POA: Diagnosis not present

## 2018-05-06 DIAGNOSIS — N401 Enlarged prostate with lower urinary tract symptoms: Secondary | ICD-10-CM | POA: Diagnosis not present

## 2018-06-13 DIAGNOSIS — Z23 Encounter for immunization: Secondary | ICD-10-CM | POA: Diagnosis not present

## 2018-07-10 DIAGNOSIS — H25013 Cortical age-related cataract, bilateral: Secondary | ICD-10-CM | POA: Diagnosis not present

## 2018-07-10 DIAGNOSIS — H524 Presbyopia: Secondary | ICD-10-CM | POA: Diagnosis not present

## 2018-07-27 ENCOUNTER — Emergency Department (HOSPITAL_COMMUNITY)
Admission: EM | Admit: 2018-07-27 | Discharge: 2018-07-27 | Disposition: A | Discharge status: Home / Self Care | Payer: Medicare Other | Attending: Emergency Medicine | Admitting: Emergency Medicine

## 2018-07-27 ENCOUNTER — Other Ambulatory Visit: Payer: Self-pay

## 2018-07-27 ENCOUNTER — Encounter (HOSPITAL_COMMUNITY): Payer: Self-pay | Admitting: Emergency Medicine

## 2018-07-27 ENCOUNTER — Emergency Department (HOSPITAL_COMMUNITY): Payer: Medicare Other

## 2018-07-27 DIAGNOSIS — Z79899 Other long term (current) drug therapy: Secondary | ICD-10-CM | POA: Insufficient documentation

## 2018-07-27 DIAGNOSIS — J181 Lobar pneumonia, unspecified organism: Secondary | ICD-10-CM | POA: Insufficient documentation

## 2018-07-27 DIAGNOSIS — Z87891 Personal history of nicotine dependence: Secondary | ICD-10-CM | POA: Diagnosis not present

## 2018-07-27 DIAGNOSIS — Z7982 Long term (current) use of aspirin: Secondary | ICD-10-CM | POA: Diagnosis not present

## 2018-07-27 DIAGNOSIS — I10 Essential (primary) hypertension: Secondary | ICD-10-CM | POA: Insufficient documentation

## 2018-07-27 DIAGNOSIS — J189 Pneumonia, unspecified organism: Secondary | ICD-10-CM

## 2018-07-27 DIAGNOSIS — J168 Pneumonia due to other specified infectious organisms: Secondary | ICD-10-CM | POA: Diagnosis not present

## 2018-07-27 DIAGNOSIS — R05 Cough: Secondary | ICD-10-CM | POA: Diagnosis not present

## 2018-07-27 LAB — CBC WITH DIFFERENTIAL/PLATELET
ABS IMMATURE GRANULOCYTES: 0.02 10*3/uL (ref 0.00–0.07)
Basophils Absolute: 0 10*3/uL (ref 0.0–0.1)
Basophils Relative: 0 %
Eosinophils Absolute: 0 10*3/uL (ref 0.0–0.5)
Eosinophils Relative: 1 %
HCT: 45.5 % (ref 39.0–52.0)
HEMOGLOBIN: 15 g/dL (ref 13.0–17.0)
IMMATURE GRANULOCYTES: 0 %
LYMPHS ABS: 1.1 10*3/uL (ref 0.7–4.0)
LYMPHS PCT: 15 %
MCH: 30 pg (ref 26.0–34.0)
MCHC: 33 g/dL (ref 30.0–36.0)
MCV: 91 fL (ref 80.0–100.0)
MONO ABS: 1.1 10*3/uL — AB (ref 0.1–1.0)
MONOS PCT: 15 %
NEUTROS ABS: 5.1 10*3/uL (ref 1.7–7.7)
Neutrophils Relative %: 69 %
Platelets: 218 10*3/uL (ref 150–400)
RBC: 5 MIL/uL (ref 4.22–5.81)
RDW: 12.9 % (ref 11.5–15.5)
WBC: 7.5 10*3/uL (ref 4.0–10.5)
nRBC: 0 % (ref 0.0–0.2)

## 2018-07-27 LAB — COMPREHENSIVE METABOLIC PANEL
ALBUMIN: 3.8 g/dL (ref 3.5–5.0)
ALT: 23 U/L (ref 0–44)
AST: 20 U/L (ref 15–41)
Alkaline Phosphatase: 45 U/L (ref 38–126)
Anion gap: 12 (ref 5–15)
BILIRUBIN TOTAL: 1.6 mg/dL — AB (ref 0.3–1.2)
BUN: 8 mg/dL (ref 8–23)
CHLORIDE: 100 mmol/L (ref 98–111)
CO2: 23 mmol/L (ref 22–32)
Calcium: 9 mg/dL (ref 8.9–10.3)
Creatinine, Ser: 1 mg/dL (ref 0.61–1.24)
GFR calc Af Amer: >60 mL/min (ref >60)
GFR calc non Af Amer: >60 mL/min (ref >60)
GLUCOSE: 96 mg/dL (ref 70–99)
POTASSIUM: 3.9 mmol/L (ref 3.5–5.1)
SODIUM: 135 mmol/L (ref 135–145)
TOTAL PROTEIN: 7.2 g/dL (ref 6.5–8.1)

## 2018-07-27 MED ORDER — LEVOFLOXACIN 750 MG PO TABS
750.0000 mg | ORAL_TABLET | Freq: Once | ORAL | Status: AC
  Administered 2018-07-27: 750 mg via ORAL
  Filled 2018-07-27: qty 1

## 2018-07-27 MED ORDER — ACETAMINOPHEN 325 MG PO TABS
650.0000 mg | ORAL_TABLET | Freq: Once | ORAL | Status: AC
  Administered 2018-07-27: 650 mg via ORAL
  Filled 2018-07-27: qty 2

## 2018-07-27 MED ORDER — LEVOFLOXACIN 750 MG PO TABS: 750.0000 mg | ORAL_TABLET | Freq: Every day | ORAL | 0 refills | Status: AC

## 2018-07-27 NOTE — ED Triage Notes (Signed)
Pt to ER for evaluation of cough and fever x3 days. Reports as high as 102.9. Pt in NAD. A/o x4. Reports coughing up yellow/brown sputum

## 2018-07-27 NOTE — Discharge Instructions (Addendum)
You were given a prescription for antibiotics. Please take the antibiotic prescription fully.   Please follow up with your primary care provider within 5-7 days for re-evaluation of your symptoms.  Please return to the emergency department for any new or worsening symptoms including persistent fevers, worsening cough, shortness of breath, chest pain, confusion.

## 2018-07-27 NOTE — ED Provider Notes (Signed)
[facility] EMERGENCY DEPARTMENT Provider Note   CSN: [id number] Arrival date & time: 07/27/18  1131     History   Chief Complaint Chief Complaint  Patient presents with  . Fever  . Cough    HPI Colin Garrett is a 72 y.o. male.  HPI   Patient is a 72 year old male with history of hypertension, hyperlipidemia, who presents the emergency department today for evaluation of a cough that began 4 days ago. Reports cough is productive with yellow/brown sputum.  He also reports fevers, chills. Has been taking ibuprofen for fevers. Denies shortness of breath, CP, rhinorrhea, congestion, sore throat. No LE swelling or pain.   Past Medical History:  Diagnosis Date  . Hx of adenomatous colonic polyps 05/15/2015  . Hyperlipidemia   . Hyperplasia of prostate   . Hypertension     Patient Active Problem List   Diagnosis Date Noted  . Hx of adenomatous colonic polyps 05/15/2015    Past Surgical History:  Procedure Laterality Date  . [place]     in [place]  . [place]OPHAGOGASTRODUODENOSCOPY     in [place]  . INGUINAL HERNIA REPAIR Left         Home Medications    Prior to Admission medications   Medication Sig Start Date End Date Taking? Authorizing Provider  aspirin 81 MG tablet Take 81 mg by mouth daily.   Yes [provider]  atorvastatin (LIPITOR) 40 MG tablet Take 40 mg by mouth daily.   Yes [provider]  ezetimibe (ZETIA) 10 MG tablet Take 10 mg by mouth daily.   Yes [provider]  metoprolol succinate (TOPROL-XL) 25 MG 24 hr tablet Take 25 mg by mouth daily.   Yes [provider]  Multiple Vitamins-Minerals (MULTIVITAMIN WITH MINERALS) tablet Take 1 tablet by mouth daily.   Yes [provider]  omeprazole (PRILOSEC) 20 MG capsule TAKE 1 CAPSULE BY MOUTH EVERY MORNING BEFORE BREAKFAST Patient taking differently: Take 20 mg by mouth daily.  10/22/16  Yes Gatha Mayer, MD  ramipril (ALTACE) 2.5 MG capsule Take 2.5  mg by mouth daily.   Yes [provider]  triamcinolone (NASACORT ALLERGY 24HR) 55 MCG/ACT AERO nasal inhaler Place 1 spray into the nose daily.   Yes [provider]  cetirizine (ZYRTEC) 10 MG tablet Take 1 tablet (10 mg total) by mouth daily. Patient not taking: Reported on 07/27/2018 07/22/14   Lutricia Feil, PA  levofloxacin (LEVAQUIN) 750 MG tablet Take 1 tablet (750 mg total) by mouth daily for 10 days. 07/27/18 08/06/18  Kenndra Morris S, PA-C    Family History Family History  Problem Relation Age of Onset  . Prostate cancer Brother 65  . Heart disease Mother        Died at age 85  . Heart disease Father        Died at age 44  . Colon cancer Neg Hx   . Colon polyps Neg Hx   . Esophageal cancer Neg Hx   . Gallbladder disease Neg Hx   . Kidney disease Neg Hx     Social History Social History   Tobacco Use  . Smoking status: Former Smoker    Packs/day: 1.00    Years: 30.00    Pack years: 30.00    Types: Cigars, Cigarettes    Last attempt to quit: 10/07/2004    Years since quitting: 13.8  . Smokeless tobacco: Never Used  Substance Use Topics  . Alcohol  use: Yes    Alcohol/week: 0.0 standard drinks    Comment: Pt drinks 1-2 drinks a day  . Drug use: No     Allergies   Patient has no known allergies.   Review of Systems Review of Systems  Constitutional: Positive for appetite change, chills and fever.  HENT: Negative for congestion, rhinorrhea and sore throat.   Eyes: Negative for visual disturbance.  Respiratory: Positive for cough and wheezing. Negative for shortness of breath.   Cardiovascular: Negative for chest pain and leg swelling.  Gastrointestinal: Negative for abdominal pain, constipation, diarrhea, nausea and vomiting.  Genitourinary: Negative for flank pain.  Musculoskeletal: Negative for back pain.  Skin: Negative for rash.  Neurological: Negative for headaches.   Physical Exam Updated Vital Signs BP 120/67   Pulse  80   Temp 98.9 F (37.2 C) (Oral)   Resp 16   SpO2 98%   Physical Exam  Constitutional: He appears well-developed and well-nourished.  Well appearing male, appears younger than stated age  HENT:  Head: Normocephalic and atraumatic.  Eyes: Conjunctivae are normal.  Neck: Neck supple.  Cardiovascular: Normal rate, regular rhythm and normal heart sounds.  No murmur heard. Pulmonary/Chest: Effort normal.  Faint wheezing to RLL, rare ronchi to RUL  Abdominal: Soft. Bowel sounds are normal. He exhibits no distension. There is no tenderness. There is no guarding.  Musculoskeletal: Normal range of motion. He exhibits no edema.  Neurological: He is alert.  Skin: Skin is warm and dry.  Psychiatric: He has a normal mood and affect.  Nursing note and vitals reviewed.  ED Treatments / Results  Labs (all labs ordered are listed, but only abnormal results are displayed) Labs Reviewed  COMPREHENSIVE METABOLIC PANEL - Abnormal; Notable for the following components:      Result Value   Total Bilirubin 1.6 (*)    All other components within normal limits  CBC WITH DIFFERENTIAL/PLATELET - Abnormal; Notable for the following components:   Monocytes Absolute 1.1 (*)    All other components within normal limits    EKG None  Radiology Dg Chest 2 View  Result Date: 07/27/2018 CLINICAL DATA:  Pt c/o of sob, cough and fevers since Saturday. Pt has hx of HTN but its controlled. EXAM: CHEST - 2 VIEW COMPARISON:  07/22/2014 FINDINGS: A mild pectus excavatum deformity. Midline trachea. Normal heart size and mediastinal contours. No pleural effusion or pneumothorax. Right upper lobe airspace disease. IMPRESSION: Right upper lobe airspace disease, most consistent with pneumonia. Followup PA and lateral chest X-ray is recommended in 3-4 weeks following trial of antibiotic therapy to ensure resolution and exclude underlying malignancy. Electronically Signed   By: Abigail Miyamoto M.D.   On: 07/27/2018 12:41     Procedures Procedures (including critical care time)  Medications Ordered in ED Medications  acetaminophen (TYLENOL) tablet 650 mg (650 mg Oral Given 07/27/18 1440)  levofloxacin (LEVAQUIN) tablet 750 mg (750 mg Oral Given 07/27/18 1729)     Initial Impression / Assessment and Plan / ED Course  I have reviewed the triage vital signs and the nursing notes.  Pertinent labs & imaging results that were available during my care of the patient were reviewed by me and considered in my medical decision making (see chart for details).     Final Clinical Impressions(s) / ED Diagnoses   Final diagnoses:  Pneumonia of right upper lobe due to infectious organism [facility])   Patient has been diagnosed with CAP via chest xray. Pt is not ill  appearing, immunocompromised, and does not have multiple co morbidities, therefore I feel like the they can be treated as an OP with abx therapy. Pt nontoxic and there are no signs of sepsis. No leukocytosis. CMP With normal kidney and liver function. Normal electrolytes. Slightly elevated bilirubin. Based on curb 65 score, pt is low risk and he is appropriate for outpatient therapy. Discussed findings and plan for outpatient therapy with antibiotics and close f/u with pt and his wife at bedside. They voice an understanding and are in agreement with the plan. Strict return precautions discussed for new or worsening sxs. Advised pcp f/u later this week. All questions answered.  Pt case was discussed with Dr. Venora Maples who personally evaluated the pt and is in agreement with the plan.   ED Discharge Orders         Ordered    levofloxacin (LEVAQUIN) 750 MG tablet  Daily     07/27/18 1730           Bishop Dublin 07/27/18 Gerald Stabs, MD 07/27/18 2319

## 2018-07-27 NOTE — ED Notes (Signed)
Patient verbalizes understanding of discharge instructions. Opportunity for questioning and answers were provided. Ambulatory at discharge in NAD.  

## 2018-08-03 DIAGNOSIS — J189 Pneumonia, unspecified organism: Secondary | ICD-10-CM | POA: Diagnosis not present

## 2018-08-03 DIAGNOSIS — Z6823 Body mass index (BMI) 23.0-23.9, adult: Secondary | ICD-10-CM | POA: Diagnosis not present

## 2018-08-03 DIAGNOSIS — I10 Essential (primary) hypertension: Secondary | ICD-10-CM | POA: Diagnosis not present

## 2018-08-26 DIAGNOSIS — J029 Acute pharyngitis, unspecified: Secondary | ICD-10-CM | POA: Diagnosis not present

## 2018-08-26 DIAGNOSIS — K219 Gastro-esophageal reflux disease without esophagitis: Secondary | ICD-10-CM | POA: Diagnosis not present

## 2018-08-26 DIAGNOSIS — J189 Pneumonia, unspecified organism: Secondary | ICD-10-CM | POA: Diagnosis not present

## 2018-08-26 DIAGNOSIS — Z6824 Body mass index (BMI) 24.0-24.9, adult: Secondary | ICD-10-CM | POA: Diagnosis not present

## 2018-09-02 ENCOUNTER — Ambulatory Visit: Payer: Medicare Other | Admitting: Internal Medicine

## 2018-09-25 ENCOUNTER — Encounter: Payer: Self-pay | Admitting: Internal Medicine

## 2018-09-25 ENCOUNTER — Ambulatory Visit (INDEPENDENT_AMBULATORY_CARE_PROVIDER_SITE_OTHER): Payer: Medicare Other | Admitting: Internal Medicine

## 2018-09-25 VITALS — BP 140/72 | HR 68 | Ht 65.75 in | Wt 152.1 lb

## 2018-09-25 DIAGNOSIS — J029 Acute pharyngitis, unspecified: Secondary | ICD-10-CM | POA: Diagnosis not present

## 2018-09-25 DIAGNOSIS — R0989 Other specified symptoms and signs involving the circulatory and respiratory systems: Secondary | ICD-10-CM

## 2018-09-25 DIAGNOSIS — R059 Cough, unspecified: Secondary | ICD-10-CM

## 2018-09-25 DIAGNOSIS — K219 Gastro-esophageal reflux disease without esophagitis: Secondary | ICD-10-CM | POA: Diagnosis not present

## 2018-09-25 DIAGNOSIS — R05 Cough: Secondary | ICD-10-CM

## 2018-09-25 NOTE — Progress Notes (Signed)
Colin Garrett 72 y.o. 01-22-1946 [id number]  Assessment & Plan:   Encounter Diagnoses  Name Primary?  . Cough Yes  . Sore throat   . Globus sensation   . Laryngopharyngeal reflux (LPR) ?     He is improved after changing from omeprazole to pantoprazole ? if that is cause-and-effect or true true unrelated.  He does have some symptoms consistent with laryngeal pharyngeal reflux.  I explained that he can take months for those to fully resolve.  If he is still having problems when he sees Dr. Joylene Draft early next year for a physical I suggest ENT evaluation.  There is no indication for an upper endoscopy at this time.  He has a history of precancerous colon polyps and a repeat colonoscopy will be due in the summer 2021.  We discussed that today.  He has no reason or indication to have routine upper GI endoscopy.  Also discussed.  I appreciate the opportunity to care for this patient. CC: Crist Infante, MD    Subjective:   Chief Complaint: Cough and globus sensation  HPI Her Milan developed a cough with pneumonia in October, it persisted, then resolved but he went to primary care and saw Caprice Beaver, NP at [facility] for pneumonia follow-up.  He described an intermittent sore throat and raw feeling on the inside felt like something was there making it difficult to swallow.  She changed him from omeprazole to pantoprazole 40 mg daily, and after this November 20 visit, he reports he is better but he still has mild globus sensation and slight sore throat.  There is no esophageal dysphagia or classic heartburn. No Known Allergies Current Meds  Medication Sig  . aspirin 81 MG tablet Take 81 mg by mouth daily.  Marland Kitchen atorvastatin (LIPITOR) 40 MG tablet Take 40 mg by mouth daily.  Marland Kitchen ezetimibe (ZETIA) 10 MG tablet Take 10 mg by mouth daily.  . metoprolol succinate (TOPROL-XL) 25 MG 24 hr tablet Take 25 mg by mouth daily.  . Multiple Vitamins-Minerals (MULTIVITAMIN WITH MINERALS)  tablet Take 1 tablet by mouth daily.  Marland Kitchen PANTOPRAZOLE SODIUM PO Take 40 mg by mouth daily.   . ramipril (ALTACE) 2.5 MG capsule Take 2.5 mg by mouth daily.  Marland Kitchen triamcinolone (NASACORT ALLERGY 24HR) 55 MCG/ACT AERO nasal inhaler Place 1 spray into the nose daily.   Past Medical History:  Diagnosis Date  . Hx of adenomatous colonic polyps 05/15/2015  . Hyperlipidemia   . Hyperplasia of prostate   . Hypertension    Past Surgical History:  Procedure Laterality Date  . COLONOSCOPY     Multiple  . ESOPHAGOGASTRODUODENOSCOPY     Multiple  . INGUINAL HERNIA REPAIR Left    Social History   Social History Narrative   Married 2 sons 2 daughters   Arts development officer, Chief Strategy Officer, Programme researcher, broadcasting/film/video   Education level: Masters in [employer]   Originally from [place] in [place] since 2013-4, prior to that split time in [place] and [place]   1 caffeine drink daily      family history includes Heart disease in his father and mother; Prostate cancer (age of onset: 81) in his brother.   Review of Systems As above  Objective:   Physical Exam BP 140/72 (BP Location: Left Arm, Patient Position: Sitting, Cuff Size: Normal)   Pulse 68   Ht 5' 5.75" (1.67 m)   Wt 152 lb 2 oz (69 kg)   BMI 24.74 kg/m  No acute distress

## 2018-09-25 NOTE — Patient Instructions (Signed)
  Glad your doing better but if you do have more issues ask Dr Joylene Draft to send you to an ENT for an evaluation.   Your colonoscopy is due around July 2021.   I appreciate the opportunity to care for you. Silvano Rusk, MD, [facility]

## 2018-11-04 DIAGNOSIS — R972 Elevated prostate specific antigen [PSA]: Secondary | ICD-10-CM | POA: Diagnosis not present

## 2018-12-09 DIAGNOSIS — I10 Essential (primary) hypertension: Secondary | ICD-10-CM | POA: Diagnosis not present

## 2018-12-09 DIAGNOSIS — E7849 Other hyperlipidemia: Secondary | ICD-10-CM | POA: Diagnosis not present

## 2018-12-09 DIAGNOSIS — Z125 Encounter for screening for malignant neoplasm of prostate: Secondary | ICD-10-CM | POA: Diagnosis not present

## 2018-12-09 DIAGNOSIS — R82998 Other abnormal findings in urine: Secondary | ICD-10-CM | POA: Diagnosis not present

## 2018-12-15 DIAGNOSIS — Z1212 Encounter for screening for malignant neoplasm of rectum: Secondary | ICD-10-CM | POA: Diagnosis not present

## 2018-12-16 DIAGNOSIS — Z1331 Encounter for screening for depression: Secondary | ICD-10-CM | POA: Diagnosis not present

## 2018-12-16 DIAGNOSIS — Z1339 Encounter for screening examination for other mental health and behavioral disorders: Secondary | ICD-10-CM | POA: Diagnosis not present

## 2018-12-16 DIAGNOSIS — E7849 Other hyperlipidemia: Secondary | ICD-10-CM | POA: Diagnosis not present

## 2018-12-16 DIAGNOSIS — Z0000 Encounter for general adult medical examination without abnormal findings: Secondary | ICD-10-CM | POA: Diagnosis not present

## 2018-12-16 DIAGNOSIS — L408 Other psoriasis: Secondary | ICD-10-CM | POA: Diagnosis not present

## 2018-12-16 DIAGNOSIS — M5416 Radiculopathy, lumbar region: Secondary | ICD-10-CM | POA: Diagnosis not present

## 2018-12-16 DIAGNOSIS — D126 Benign neoplasm of colon, unspecified: Secondary | ICD-10-CM | POA: Diagnosis not present

## 2018-12-16 DIAGNOSIS — M25561 Pain in right knee: Secondary | ICD-10-CM | POA: Diagnosis not present

## 2018-12-16 DIAGNOSIS — R6882 Decreased libido: Secondary | ICD-10-CM | POA: Diagnosis not present

## 2018-12-16 DIAGNOSIS — K219 Gastro-esophageal reflux disease without esophagitis: Secondary | ICD-10-CM | POA: Diagnosis not present

## 2018-12-16 DIAGNOSIS — N401 Enlarged prostate with lower urinary tract symptoms: Secondary | ICD-10-CM | POA: Diagnosis not present

## 2018-12-16 DIAGNOSIS — I10 Essential (primary) hypertension: Secondary | ICD-10-CM | POA: Diagnosis not present

## 2019-04-26 DIAGNOSIS — R972 Elevated prostate specific antigen [PSA]: Secondary | ICD-10-CM | POA: Diagnosis not present

## 2019-05-03 DIAGNOSIS — R351 Nocturia: Secondary | ICD-10-CM | POA: Diagnosis not present

## 2019-05-03 DIAGNOSIS — N401 Enlarged prostate with lower urinary tract symptoms: Secondary | ICD-10-CM | POA: Diagnosis not present

## 2019-05-03 DIAGNOSIS — R972 Elevated prostate specific antigen [PSA]: Secondary | ICD-10-CM | POA: Diagnosis not present

## 2019-06-03 DIAGNOSIS — Z23 Encounter for immunization: Secondary | ICD-10-CM | POA: Diagnosis not present

## 2019-06-08 DIAGNOSIS — H0015 Chalazion left lower eyelid: Secondary | ICD-10-CM | POA: Diagnosis not present

## 2019-06-13 IMAGING — DX DG CHEST 2V
2 series · 2 of 2 positions shown · non-contrast
Comparison: 07/22/2014

CLINICAL DATA: Pt c/o of sob, cough and fevers since [REDACTED]. Pt
has hx of HTN but its controlled.

EXAM:
CHEST - 2 VIEW

[chest pa]
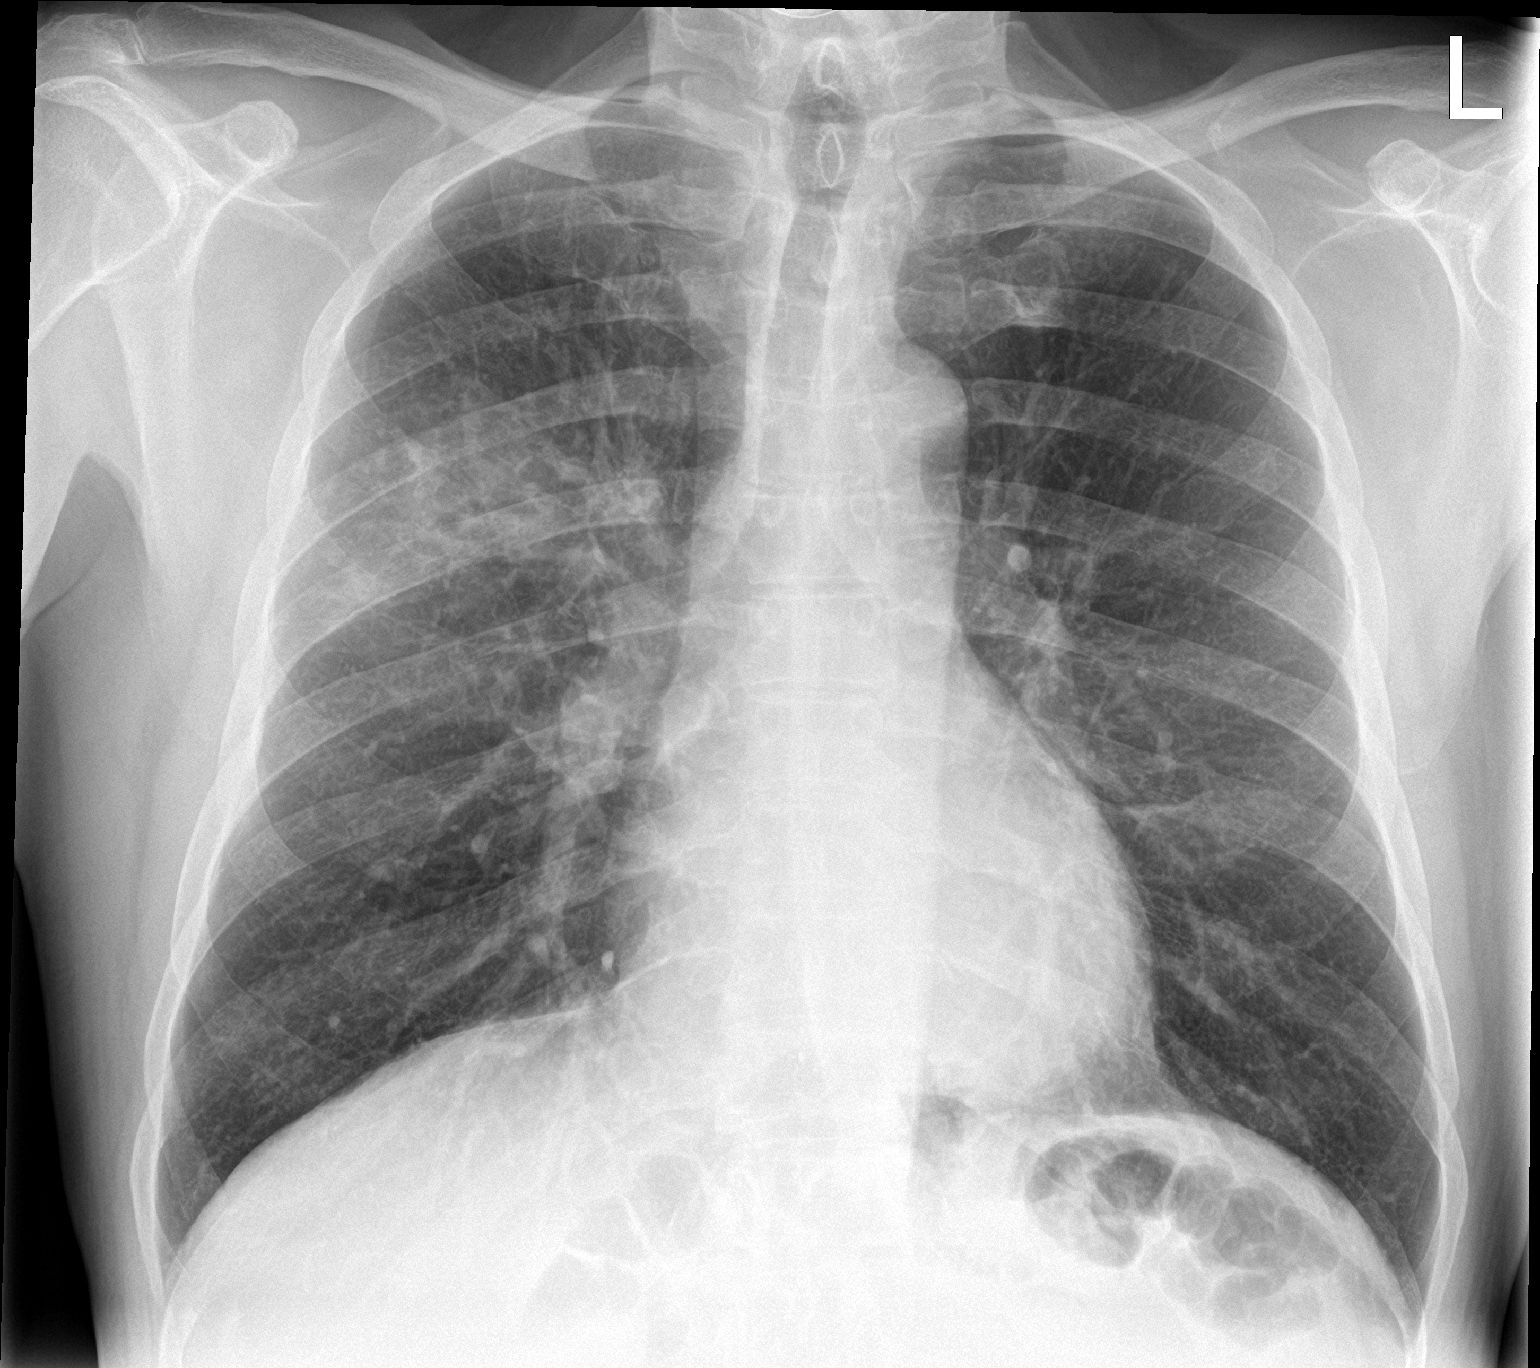

[chest lat]
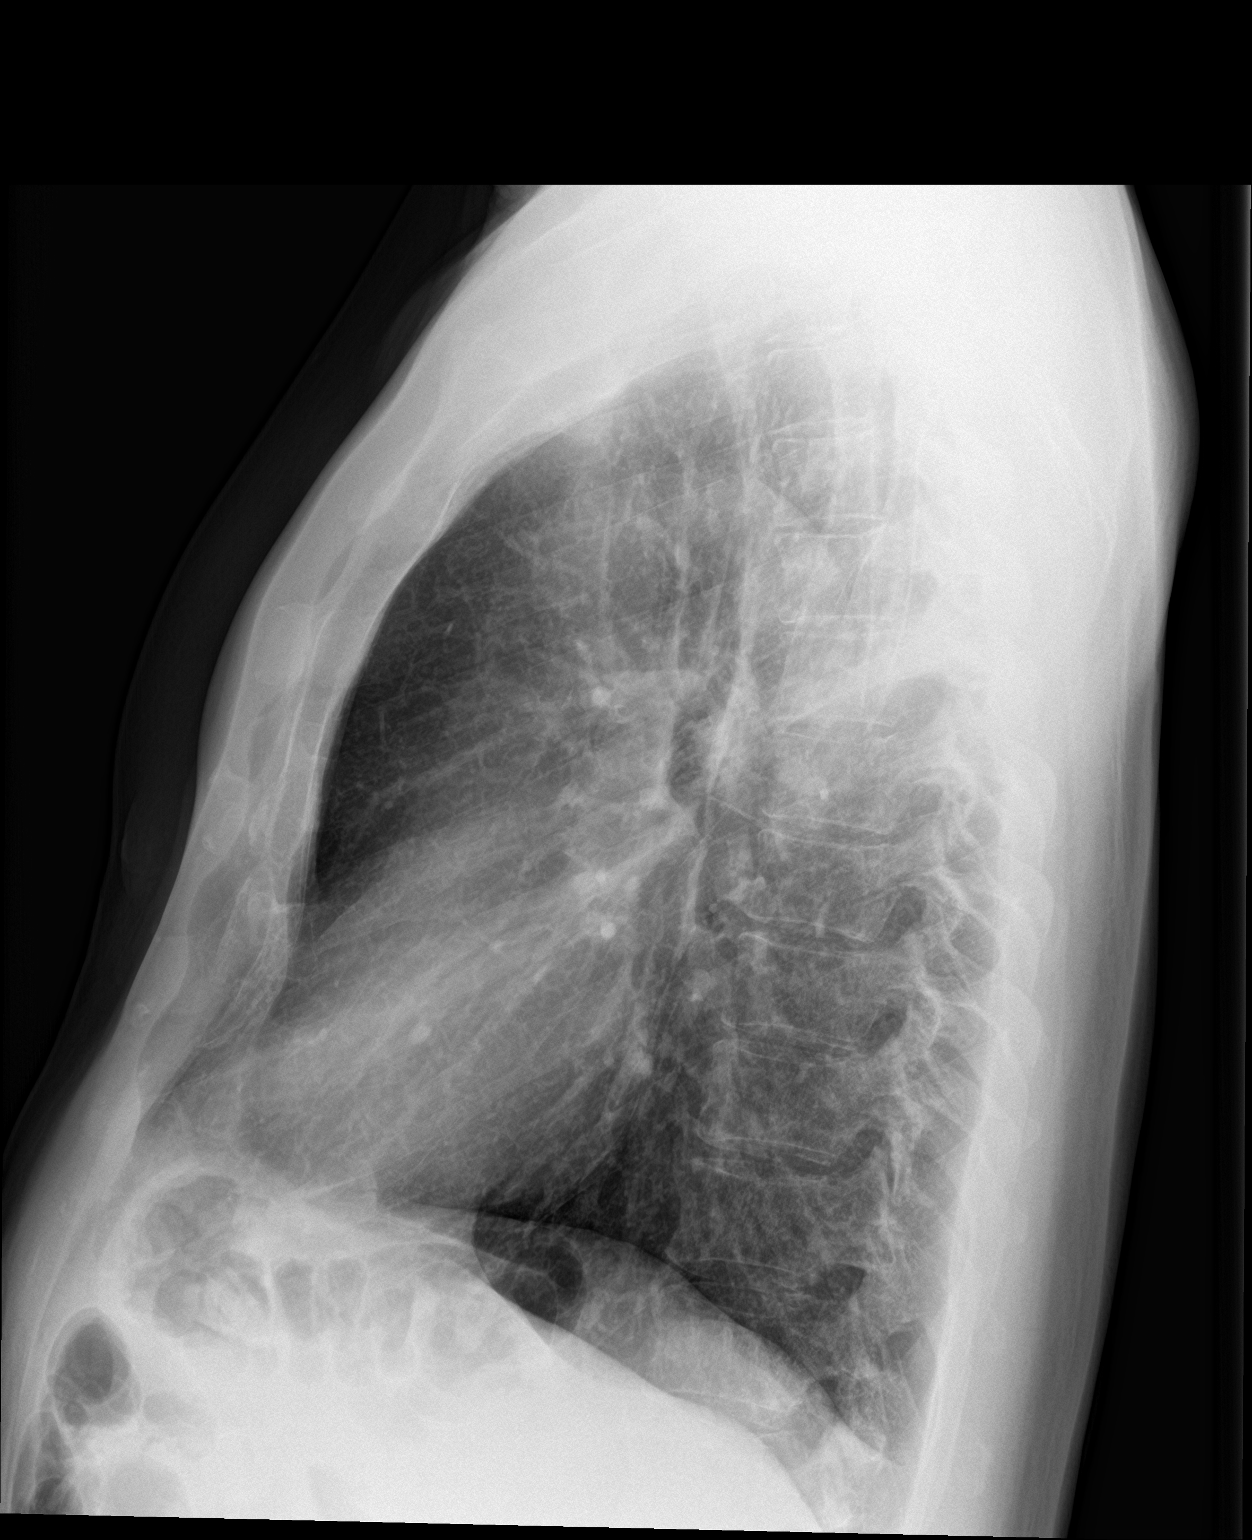

[2 of 2 positions shown; findings below may reference images not displayed]

FINDINGS: A mild pectus excavatum deformity. Midline trachea. Normal heart
size and mediastinal contours. No pleural effusion or pneumothorax.
Right upper lobe airspace disease.
IMPRESSION: Right upper lobe airspace disease, most consistent with pneumonia.
Followup PA and lateral chest X-ray is recommended in 3-4 weeks
following trial of antibiotic therapy to ensure resolution and
exclude underlying malignancy.

## 2019-07-08 DIAGNOSIS — H0015 Chalazion left lower eyelid: Secondary | ICD-10-CM | POA: Diagnosis not present

## 2019-08-06 ENCOUNTER — Encounter (HOSPITAL_COMMUNITY): Payer: Self-pay | Admitting: Emergency Medicine

## 2019-08-06 ENCOUNTER — Other Ambulatory Visit: Payer: Self-pay

## 2019-08-06 ENCOUNTER — Emergency Department (HOSPITAL_COMMUNITY)
Admission: EM | Admit: 2019-08-06 | Discharge: 2019-08-06 | Disposition: A | Discharge status: Home / Self Care | Payer: Medicare Other | Attending: Emergency Medicine | Admitting: Emergency Medicine

## 2019-08-06 DIAGNOSIS — Y929 Unspecified place or not applicable: Secondary | ICD-10-CM | POA: Diagnosis not present

## 2019-08-06 DIAGNOSIS — I10 Essential (primary) hypertension: Secondary | ICD-10-CM | POA: Diagnosis not present

## 2019-08-06 DIAGNOSIS — S80212A Abrasion, left knee, initial encounter: Secondary | ICD-10-CM | POA: Diagnosis not present

## 2019-08-06 DIAGNOSIS — Z79899 Other long term (current) drug therapy: Secondary | ICD-10-CM | POA: Insufficient documentation

## 2019-08-06 DIAGNOSIS — Z7982 Long term (current) use of aspirin: Secondary | ICD-10-CM | POA: Diagnosis not present

## 2019-08-06 DIAGNOSIS — S61431A Puncture wound without foreign body of right hand, initial encounter: Secondary | ICD-10-CM | POA: Insufficient documentation

## 2019-08-06 DIAGNOSIS — Y999 Unspecified external cause status: Secondary | ICD-10-CM | POA: Insufficient documentation

## 2019-08-06 DIAGNOSIS — W540XXA Bitten by dog, initial encounter: Secondary | ICD-10-CM | POA: Insufficient documentation

## 2019-08-06 DIAGNOSIS — Y93K1 Activity, walking an animal: Secondary | ICD-10-CM | POA: Diagnosis not present

## 2019-08-06 DIAGNOSIS — Z87891 Personal history of nicotine dependence: Secondary | ICD-10-CM | POA: Insufficient documentation

## 2019-08-06 DIAGNOSIS — S6991XA Unspecified injury of right wrist, hand and finger(s), initial encounter: Secondary | ICD-10-CM | POA: Diagnosis present

## 2019-08-06 DIAGNOSIS — Z23 Encounter for immunization: Secondary | ICD-10-CM | POA: Diagnosis not present

## 2019-08-06 MED ORDER — AMOXICILLIN-POT CLAVULANATE 875-125 MG PO TABS
1.0000 | ORAL_TABLET | Freq: Once | ORAL | Status: AC
  Administered 2019-08-06: 15:00:00 1 via ORAL
  Filled 2019-08-06: qty 1

## 2019-08-06 MED ORDER — AMOXICILLIN-POT CLAVULANATE 875-125 MG PO TABS: 1.0000 | ORAL_TABLET | Freq: Two times a day (BID) | ORAL | 0 refills | Status: AC

## 2019-08-06 MED ORDER — TETANUS-DIPHTH-ACELL PERTUSSIS 5-2.5-18.5 LF-MCG/0.5 IM SUSP
0.5000 mL | Freq: Once | INTRAMUSCULAR | Status: AC
  Administered 2019-08-06: 0.5 mL via INTRAMUSCULAR
  Filled 2019-08-06: qty 0.5

## 2019-08-06 NOTE — ED Triage Notes (Addendum)
Dog bite rt hand puncture wounds , bleeding controlled at this time And scrapes , UNk  Last tetanus , p[t has info on dog

## 2019-08-06 NOTE — Discharge Instructions (Addendum)
You have been seen today for dog bite. Please read and follow all provided instructions. Return to the emergency room for worsening condition or new concerning symptoms.    Tetanus was updated at today's visit.   1. Medications:  Prescription sent to your pharmacy for Augmentin.  This is an antibiotic.  Please take as prescribed. -You can take Tylenol as needed for pain.  Please take as directed on the bottle. Continue usual home medications Take medications as prescribed. Please review all of the medicines and only take them if you do not have an allergy to them.   2. Treatment: Wash and clean the wound daily with soap and water.  Wear a Band-Aid and doing activities that would allow to her bacteria to get into the wounds.  You can also use antibiotic ointment to help promote healing. -Watch for signs of infection including swelling, surrounding redness or streaking, pus draining, fever  3. Follow Up: Please follow up with your primary doctor in 2-5 days for discussion of your diagnoses and further evaluation after today's visit; Call today to arrange your follow up.  -Advised to have your wound checked by primary care provider within 2 days.  ?

## 2019-08-06 NOTE — ED Notes (Signed)
Irrigated rt wrist with 1000cc NS.

## 2019-08-06 NOTE — ED Provider Notes (Signed)
[facility] EMERGENCY DEPARTMENT Provider Note   CSN: [id number] Arrival date & time: 08/06/19  1336     History   Chief Complaint Chief Complaint  Patient presents with  . Animal Bite    HPI Colin Garrett is a 73 y.o. male past medical history listed below presents emergency room today with chief complaint of dog bite.  Onset was acute happening 1 hour prior to arrival.  Patient states he was out walking his dog when a dog ran out house in the neighborhood and started to attack his dog.  He attempted to pull the dog off of his and was bit on the right hand.  He denies any associated pain.  He washed the wound out immediately.  He thinks the bites occurred from the other dog.  He was able to get a copy of the dog's vaccination records proving he is up-to-date on his rabies vaccine.  Patient states his dog is also up-to-date on the rabies vaccine.  He is unsure of last tetanus immunization.  Patient states he fell on his left knee while trying to break up a dog fight.  He has been able to walk and bear weight on the leg since.  He has minor superficial abrasions to his left knee.  He denies fever, chills, numbness, weakness, tingling.  Patient is not anticoagulated.  He did not take anything for pain prior to arrival.   Past Medical History:  Diagnosis Date  . Hx of adenomatous colonic polyps 05/15/2015  . Hyperlipidemia   . Hyperplasia of prostate   . Hypertension     Patient Active Problem List   Diagnosis Date Noted  . Hx of adenomatous colonic polyps 05/15/2015    Past Surgical History:  Procedure Laterality Date  . COLONOSCOPY     Multiple  . ESOPHAGOGASTRODUODENOSCOPY     Multiple  . INGUINAL HERNIA REPAIR Left         Home Medications    Prior to Admission medications   Medication Sig Start Date End Date Taking? Authorizing Provider  amoxicillin-clavulanate (AUGMENTIN) 875-125 MG tablet Take 1 tablet by mouth every 12 (twelve) hours for 7 days.  08/06/19 08/13/19  ,  E, PA-C  aspirin 81 MG tablet Take 81 mg by mouth daily.    [provider]  atorvastatin (LIPITOR) 40 MG tablet Take 40 mg by mouth daily.    [provider]  ezetimibe (ZETIA) 10 MG tablet Take 10 mg by mouth daily.    [provider]  metoprolol succinate (TOPROL-XL) 25 MG 24 hr tablet Take 25 mg by mouth daily.    [provider]  Multiple Vitamins-Minerals (MULTIVITAMIN WITH MINERALS) tablet Take 1 tablet by mouth daily.    [provider]  PANTOPRAZOLE SODIUM PO Take 40 mg by mouth daily.     [provider]  ramipril (ALTACE) 2.5 MG capsule Take 2.5 mg by mouth daily.    [provider]  triamcinolone (NASACORT ALLERGY 24HR) 55 MCG/ACT AERO nasal inhaler Place 1 spray into the nose daily.    [provider]    Family History Family History  Problem Relation Age of Onset  . Prostate cancer Brother 8  . Heart disease Mother        Died at age 55  . Heart disease Father        Died at age 33  . Colon cancer Neg Hx   . Colon polyps Neg Hx   . Esophageal cancer  Neg Hx   . Gallbladder disease Neg Hx   . Kidney disease Neg Hx     Social History Social History   Tobacco Use  . Smoking status: Former Smoker    Packs/day: 1.00    Years: 30.00    Pack years: 30.00    Types: Cigars, Cigarettes    Quit date: 10/07/2004    Years since quitting: 14.8  . Smokeless tobacco: Never Used  Substance Use Topics  . Alcohol use: Yes    Alcohol/week: 0.0 standard drinks    Comment: Pt drinks 1-2 drinks a day  . Drug use: No     Allergies   Patient has no known allergies.   Review of Systems Review of Systems  Constitutional: Negative for chills and fever.  Cardiovascular: Negative for chest pain and leg swelling.  Gastrointestinal: Negative for abdominal pain, nausea and vomiting.  Musculoskeletal: Negative for arthralgias, back pain, gait problem, joint swelling and  myalgias.  Skin: Positive for wound.  Neurological: Negative for weakness and numbness.     Physical Exam Updated Vital Signs BP 125/83   Pulse 83   Temp 98.9 F (37.2 C)   Resp 20   Ht 5\' 7"  (1.702 m)   Wt 63.5 kg   SpO2 96%   BMI 21.93 kg/m   Physical Exam Vitals signs and nursing note reviewed.  Constitutional:      Appearance: He is well-developed. He is not ill-appearing or toxic-appearing.  HENT:     Head: Normocephalic and atraumatic.     Nose: Nose normal.  Eyes:     General: No scleral icterus.       Right eye: No discharge.        Left eye: No discharge.     Conjunctiva/sclera: Conjunctivae normal.  Neck:     Musculoskeletal: Normal range of motion.     Vascular: No JVD.  Cardiovascular:     Rate and Rhythm: Normal rate and regular rhythm.     Pulses: Normal pulses.          Radial pulses are 2+ on the right side and 2+ on the left side.     Heart sounds: Normal heart sounds.  Pulmonary:     Effort: Pulmonary effort is normal.     Breath sounds: Normal breath sounds.  Abdominal:     General: There is no distension.  Musculoskeletal: Normal range of motion.       Legs:     Comments: Full range of motion of right hand.  Sensation resistance intact.  Opposition of thumb intact.  Wiggle all fingers.  Cap refill brisk  Skin:    General: Skin is warm and dry.     Comments: 3 small puncture wounds on dorsal aspect of right hand near the base of the thumb.  No bleeding noted.  No surrounding erythema or edema  Neurological:     Mental Status: He is oriented to person, place, and time.     GCS: GCS eye subscore is 4. GCS verbal subscore is 5. GCS motor subscore is 6.     Comments: Fluent speech, no facial droop.  Psychiatric:        Behavior: Behavior normal.      ED Treatments / Results  Labs (all labs ordered are listed, but only abnormal results are displayed) Labs Reviewed - No data to display  EKG None  Radiology No results found.   Procedures Procedures (including critical care time)  Medications Ordered in ED  Medications  amoxicillin-clavulanate (AUGMENTIN) 875-125 MG per tablet 1 tablet (1 tablet Oral Given 08/06/19 1439)  Tdap (BOOSTRIX) injection 0.5 mL (0.5 mLs Intramuscular Given 08/06/19 1439)     Initial Impression / Assessment and Plan / ED Course  I have reviewed the triage vital signs and the nursing notes.  Pertinent labs & imaging results that were available during my care of the patient were reviewed by me and considered in my medical decision making (see chart for details).  Patient seen and examined.  On arrival he was tachycardic to 115, suspect anxiety will recheck.  Patient presents with puncture wounds from a dog bite.  Pt wounds irrigated well with 18ga angiocath with sterile saline.  Wounds examined with visualization of the base and no foreign bodies seen.  Pt Alert and oriented, NAD, nontoxic, nonseptic appearing.  Capillary refill intact and pt without neurologic deficit.  Patient shared decision-making.  Do not feel x-rays of right hand or left knee are indicated at this time.  Patient is able to ambulate with steady gait and bear weight on left leg.  Knee exam is overall unremarkable.  Rechecked patient's vitals and tachycardia has resolved.  He is normotensive with normal vital signs.  Patient tetanus updated.  Patient has paperwork at the bedside proving the dog is up-to-date on immunizations.  He denies any pain medications at this time.  Wounds not closed secondary to concern for infection. We'll discharge home with  Augmentin and requests for close follow-up with PCP or back in the ER.   Portions of this note were generated with [employer]. Dictation errors may occur despite best attempts at proofreading.    Final Clinical Impressions(s) / ED Diagnoses   Final diagnoses:  Dog bite, initial encounter    ED Discharge Orders         Ordered    amoxicillin-clavulanate  (AUGMENTIN) 875-125 MG tablet  Every 12 hours     08/06/19 1457           , Harley Hallmark, PA-C 08/06/19 [place], DO 08/06/19 1548

## 2019-08-10 DIAGNOSIS — R197 Diarrhea, unspecified: Secondary | ICD-10-CM | POA: Diagnosis not present

## 2019-08-10 DIAGNOSIS — M79605 Pain in left leg: Secondary | ICD-10-CM | POA: Diagnosis not present

## 2019-08-10 DIAGNOSIS — W540XXA Bitten by dog, initial encounter: Secondary | ICD-10-CM | POA: Diagnosis not present

## 2019-08-12 DIAGNOSIS — W540XXS Bitten by dog, sequela: Secondary | ICD-10-CM | POA: Diagnosis not present

## 2019-08-12 DIAGNOSIS — S61401S Unspecified open wound of right hand, sequela: Secondary | ICD-10-CM | POA: Diagnosis not present

## 2019-08-20 DIAGNOSIS — H0015 Chalazion left lower eyelid: Secondary | ICD-10-CM | POA: Diagnosis not present

## 2019-10-27 ENCOUNTER — Ambulatory Visit: Payer: Medicare Other | Attending: Internal Medicine

## 2019-10-27 DIAGNOSIS — Z23 Encounter for immunization: Secondary | ICD-10-CM | POA: Diagnosis not present

## 2019-10-27 NOTE — Progress Notes (Signed)
   Covid-19 Vaccination Clinic  Name:  Colin Garrett    MRN: [id number] DOB: 1945-12-12  10/27/2019  Mr. Colin Garrett was observed post Covid-19 immunization for 15 minutes without incidence. He was provided with Vaccine Information Sheet and instruction to access the V-Safe system.   Mr. Colin Garrett was instructed to call 911 with any severe reactions post vaccine: Marland Kitchen Difficulty breathing  . Swelling of your face and throat  . A fast heartbeat  . A bad rash all over your body  . Dizziness and weakness    Immunizations Administered    Name Date Dose VIS Date Route   Pfizer COVID-19 Vaccine 10/27/2019 10:42 AM 0.3 mL 09/17/2019 Intramuscular   Manufacturer: [employer]   Lot: [id number]   [place]: [id number]

## 2019-11-17 ENCOUNTER — Ambulatory Visit: Payer: Medicare Other | Attending: Internal Medicine

## 2019-11-17 DIAGNOSIS — Z23 Encounter for immunization: Secondary | ICD-10-CM

## 2019-11-17 NOTE — Progress Notes (Signed)
   Covid-19 Vaccination Clinic  Name:  Colin Garrett    MRN: [id number] DOB: 03-03-1946  11/17/2019  [name] was observed post Covid-19 immunization for 15 minutes without incidence. He was provided with Vaccine Information Sheet and instruction to access the V-Safe system.   [name] was instructed to call 911 with any severe reactions post vaccine: Marland Kitchen Difficulty breathing  . Swelling of your face and throat  . A fast heartbeat  . A bad rash all over your body  . Dizziness and weakness    Immunizations Administered    Name Date Dose VIS Date Route   Pfizer COVID-19 Vaccine 11/17/2019  2:35 PM 0.3 mL 09/17/2019 Intramuscular   Manufacturer: [place]   Lot: [id number]   [place]: [id number]

## 2019-12-01 DIAGNOSIS — R972 Elevated prostate specific antigen [PSA]: Secondary | ICD-10-CM | POA: Diagnosis not present

## 2020-01-10 ENCOUNTER — Other Ambulatory Visit: Payer: Self-pay | Admitting: Internal Medicine

## 2020-01-10 DIAGNOSIS — J329 Chronic sinusitis, unspecified: Secondary | ICD-10-CM | POA: Diagnosis not present

## 2020-01-10 DIAGNOSIS — R6884 Jaw pain: Secondary | ICD-10-CM | POA: Diagnosis not present

## 2020-01-11 DIAGNOSIS — R6884 Jaw pain: Secondary | ICD-10-CM | POA: Diagnosis not present

## 2020-01-11 DIAGNOSIS — J329 Chronic sinusitis, unspecified: Secondary | ICD-10-CM | POA: Diagnosis not present

## 2020-01-20 ENCOUNTER — Other Ambulatory Visit: Payer: Self-pay | Admitting: Internal Medicine

## 2020-01-20 DIAGNOSIS — R6884 Jaw pain: Secondary | ICD-10-CM

## 2020-01-20 DIAGNOSIS — J329 Chronic sinusitis, unspecified: Secondary | ICD-10-CM

## 2020-01-25 DIAGNOSIS — D539 Nutritional anemia, unspecified: Secondary | ICD-10-CM | POA: Diagnosis not present

## 2020-01-25 DIAGNOSIS — D7589 Other specified diseases of blood and blood-forming organs: Secondary | ICD-10-CM | POA: Diagnosis not present

## 2020-01-25 DIAGNOSIS — E7849 Other hyperlipidemia: Secondary | ICD-10-CM | POA: Diagnosis not present

## 2020-01-25 DIAGNOSIS — Z125 Encounter for screening for malignant neoplasm of prostate: Secondary | ICD-10-CM | POA: Diagnosis not present

## 2020-01-26 ENCOUNTER — Other Ambulatory Visit: Payer: Medicare Other

## 2020-01-28 DIAGNOSIS — H25013 Cortical age-related cataract, bilateral: Secondary | ICD-10-CM | POA: Diagnosis not present

## 2020-01-28 DIAGNOSIS — H5203 Hypermetropia, bilateral: Secondary | ICD-10-CM | POA: Diagnosis not present

## 2020-02-01 ENCOUNTER — Other Ambulatory Visit: Payer: Self-pay | Admitting: Internal Medicine

## 2020-02-01 DIAGNOSIS — E785 Hyperlipidemia, unspecified: Secondary | ICD-10-CM | POA: Diagnosis not present

## 2020-02-01 DIAGNOSIS — I10 Essential (primary) hypertension: Secondary | ICD-10-CM | POA: Diagnosis not present

## 2020-02-01 DIAGNOSIS — Z0000 Encounter for general adult medical examination without abnormal findings: Secondary | ICD-10-CM | POA: Diagnosis not present

## 2020-02-01 DIAGNOSIS — Z1339 Encounter for screening examination for other mental health and behavioral disorders: Secondary | ICD-10-CM | POA: Diagnosis not present

## 2020-02-01 DIAGNOSIS — Z1331 Encounter for screening for depression: Secondary | ICD-10-CM | POA: Diagnosis not present

## 2020-02-01 DIAGNOSIS — M25561 Pain in right knee: Secondary | ICD-10-CM | POA: Diagnosis not present

## 2020-02-01 DIAGNOSIS — N401 Enlarged prostate with lower urinary tract symptoms: Secondary | ICD-10-CM | POA: Diagnosis not present

## 2020-02-01 DIAGNOSIS — M5416 Radiculopathy, lumbar region: Secondary | ICD-10-CM | POA: Diagnosis not present

## 2020-02-01 DIAGNOSIS — R05 Cough: Secondary | ICD-10-CM | POA: Diagnosis not present

## 2020-02-01 DIAGNOSIS — Z1212 Encounter for screening for malignant neoplasm of rectum: Secondary | ICD-10-CM | POA: Diagnosis not present

## 2020-02-01 DIAGNOSIS — R82998 Other abnormal findings in urine: Secondary | ICD-10-CM | POA: Diagnosis not present

## 2020-02-01 DIAGNOSIS — J329 Chronic sinusitis, unspecified: Secondary | ICD-10-CM | POA: Diagnosis not present

## 2020-02-01 DIAGNOSIS — D126 Benign neoplasm of colon, unspecified: Secondary | ICD-10-CM | POA: Diagnosis not present

## 2020-02-04 ENCOUNTER — Encounter: Payer: Self-pay | Admitting: Internal Medicine

## 2020-02-17 ENCOUNTER — Ambulatory Visit
Admission: RE | Admit: 2020-02-17 | Discharge: 2020-02-17 | Disposition: A | Discharge status: Home / Self Care | Payer: No Typology Code available for payment source | Source: Ambulatory Visit | Attending: Internal Medicine | Admitting: Internal Medicine

## 2020-02-17 DIAGNOSIS — E785 Hyperlipidemia, unspecified: Secondary | ICD-10-CM

## 2020-03-15 ENCOUNTER — Ambulatory Visit: Payer: Self-pay | Admitting: Cardiology

## 2020-03-24 ENCOUNTER — Other Ambulatory Visit: Payer: Self-pay

## 2020-03-24 ENCOUNTER — Ambulatory Visit (AMBULATORY_SURGERY_CENTER): Payer: Self-pay | Admitting: *Deleted

## 2020-03-24 VITALS — Ht 66.0 in | Wt 150.6 lb

## 2020-03-24 DIAGNOSIS — Z86010 Personal history of colonic polyps: Secondary | ICD-10-CM

## 2020-03-24 NOTE — Progress Notes (Signed)
2nd dose of covid vaccine 11-17-19  Pt is aware that care partner will wait in the car during procedure; if they feel like they will be too hot or cold to wait in the car; they may wait in the 4 th floor lobby. Patient is aware to bring only one care partner. We want them to wear a mask (we do not have any that we can provide them), practice social distancing, and we will check their temperatures when they get here.  I did remind the patient that their care partner needs to stay in the parking lot the entire time and have a cell phone available, we will call them when the pt is ready for discharge. Patient will wear mask into building.   No trouble with anesthesia, difficulty with intubation or hx/fam hx of malignant hyperthermia per pt   No egg or soy allergy  No home oxygen use   No medications for weight loss taken  Pt denies constipation issues

## 2020-04-07 ENCOUNTER — Other Ambulatory Visit: Payer: Self-pay

## 2020-04-07 ENCOUNTER — Encounter: Payer: Self-pay | Admitting: Internal Medicine

## 2020-04-07 ENCOUNTER — Ambulatory Visit (AMBULATORY_SURGERY_CENTER): Payer: Medicare Other | Admitting: Internal Medicine

## 2020-04-07 VITALS — BP 104/57 | HR 66 | Temp 97.8°F | Resp 13 | Ht 66.0 in | Wt 150.0 lb

## 2020-04-07 DIAGNOSIS — D124 Benign neoplasm of descending colon: Secondary | ICD-10-CM | POA: Diagnosis not present

## 2020-04-07 DIAGNOSIS — Z86010 Personal history of colonic polyps: Secondary | ICD-10-CM

## 2020-04-07 DIAGNOSIS — D123 Benign neoplasm of transverse colon: Secondary | ICD-10-CM

## 2020-04-07 MED ORDER — SODIUM CHLORIDE 0.9 % IV SOLN: 500.0000 mL | Freq: Once | INTRAVENOUS | Status: DC

## 2020-04-07 NOTE — Op Note (Signed)
[facility] Patient Name: Colin Garrett Procedure Date: 04/07/2020 9:02 AM MRN: [id number] Endoscopist: Gatha Mayer , MD Age: 74 Referring MD:  Date of Birth: October 21, 1945 Gender: Male Account #: [id number] Procedure:                Colonoscopy Indications:              Surveillance: Personal history of adenomatous                            polyps on last colonoscopy 5 years ago Medicines:                Propofol per Anesthesia, Monitored Anesthesia Care Procedure:                Pre-Anesthesia Assessment:                           - Prior to the procedure, a History and Physical                            was performed, and patient medications and                            allergies were reviewed. The patient's tolerance of                            previous anesthesia was also reviewed. The risks                            and benefits of the procedure and the sedation                            options and risks were discussed with the patient.                            All questions were answered, and informed consent                            was obtained. Prior Anticoagulants: The patient has                            taken no previous anticoagulant or antiplatelet                            agents. ASA Grade Assessment: II - A patient with                            mild systemic disease. After reviewing the risks                            and benefits, the patient was deemed in                            satisfactory condition to undergo the procedure.  After obtaining informed consent, the colonoscope                            was passed under direct vision. Throughout the                            procedure, the patient's blood pressure, pulse, and                            oxygen saturations were monitored continuously. The                            Colonoscope was introduced through the anus and                            advanced  to the the cecum, identified by                            appendiceal orifice and ileocecal valve. The                            colonoscopy was performed without difficulty. The                            patient tolerated the procedure well. The quality                            of the bowel preparation was excellent. The                            ileocecal valve, appendiceal orifice, and rectum                            were photographed. The bowel preparation used was                            Miralax via split dose instruction. Scope In: 9:28:06 AM Scope Out: 9:43:28 AM Scope Withdrawal Time: 0 hours 11 minutes 51 seconds  Total Procedure Duration: 0 hours 15 minutes 22 seconds  Findings:                 The perianal examination was normal.                           The digital rectal exam findings include enlarged                            prostate. Pertinent negatives include no palpable                            rectal lesions.                           Two sessile polyps were found in the descending  colon and transverse colon. The polyps were                            diminutive in size. These polyps were removed with                            a cold snare. Resection and retrieval were                            complete. Verification of patient identification                            for the specimen was done. Estimated blood loss was                            minimal.                           Many small and large-mouthed diverticula were found                            in the sigmoid colon.                           External and internal hemorrhoids were found.                           The exam was otherwise without abnormality on                            direct and retroflexion views. Complications:            No immediate complications. Estimated Blood Loss:     Estimated blood loss was minimal. Impression:               -  Enlarged prostate found on digital rectal exam.                           - Two diminutive polyps in the descending colon and                            in the transverse colon, removed with a cold snare.                            Resected and retrieved.                           - Diverticulosis in the sigmoid colon.                           - External and internal hemorrhoids.                           - The examination was otherwise normal on direct  and retroflexion views.                           - Personal history of colonic polyps. Adenomas over                            the years - single diminutive last exam 2016 Recommendation:           - Patient has a contact number available for                            emergencies. The signs and symptoms of potential                            delayed complications were discussed with the                            patient. Return to normal activities tomorrow.                            Written discharge instructions were provided to the                            patient.                           - Resume previous diet.                           - Continue present medications.                           - Repeat colonoscopy may be recommended. The                            colonoscopy date will be determined after pathology                            results from today's exam become available for                            review. Gatha Mayer, MD 04/07/2020 9:55:12 AM This report has been signed electronically.

## 2020-04-07 NOTE — Progress Notes (Signed)
Called to room to assist during endoscopic procedure.  Patient ID and intended procedure confirmed with present staff. Received instructions for my participation in the procedure from the performing physician.  

## 2020-04-07 NOTE — Progress Notes (Signed)
pt tolerated well. VSS. awake and to recovery. Report given to RN.  

## 2020-04-07 NOTE — Progress Notes (Signed)
Pt's states no medical or surgical changes since previsit or office visit. 

## 2020-04-07 NOTE — Patient Instructions (Addendum)
I found and removed 2 tiny polyps.  You still have diverticulosis - thickened muscle rings and pouches in the colon wall. Please read the handout about this condition. Also saw swollen hemorrhoids.  I will let you know pathology results and when to have another routine colonoscopy by mail and/or My Chart.  I appreciate the opportunity to care for you. Gatha Mayer, MD, FACG  YOU HAD AN ENDOSCOPIC PROCEDURE TODAY AT [facility]:   Refer to the procedure report that was given to you for any specific questions about what was found during the examination.  If the procedure report does not answer your questions, please call your gastroenterologist to clarify.  If you requested that your care partner not be given the details of your procedure findings, then the procedure report has been included in a sealed envelope for you to review at your convenience later.  YOU SHOULD EXPECT: Some feelings of bloating in the abdomen. Passage of more gas than usual.  Walking can help get rid of the air that was put into your GI tract during the procedure and reduce the bloating. If you had a lower endoscopy (such as a colonoscopy or flexible sigmoidoscopy) you may notice spotting of blood in your stool or on the toilet paper. If you underwent a bowel prep for your procedure, you may not have a normal bowel movement for a few days.  Please Note:  You might notice some irritation and congestion in your nose or some drainage.  This is from the oxygen used during your procedure.  There is no need for concern and it should clear up in a day or so.  SYMPTOMS TO REPORT IMMEDIATELY:   Following lower endoscopy (colonoscopy or flexible sigmoidoscopy):  Excessive amounts of blood in the stool  Significant tenderness or worsening of abdominal pains  Swelling of the abdomen that is new, acute  Fever of 100F or higher  For urgent or emergent issues, a gastroenterologist can be reached at any hour by  calling [phone]. Do not use MyChart messaging for urgent concerns.    DIET:  We do recommend a small meal at first, but then you may proceed to your regular diet.  Drink plenty of fluids but you should avoid alcoholic beverages for 24 hours.  ACTIVITY:  You should plan to take it easy for the rest of today and you should NOT DRIVE or use heavy machinery until tomorrow (because of the sedation medicines used during the test).    FOLLOW UP: Our staff will call the number listed on your records 48-72 hours following your procedure to check on you and address any questions or concerns that you may have regarding the information given to you following your procedure. If we do not reach you, we will leave a message.  We will attempt to reach you two times.  During this call, we will ask if you have developed any symptoms of COVID 19. If you develop any symptoms (ie: fever, flu-like symptoms, shortness of breath, cough etc.) before then, please call [phone].  If you test positive for Covid 19 in the 2 weeks post procedure, please call and report this information to [place].    If any biopsies were taken you will be contacted by phone or by letter within the next 1-3 weeks.  Please call us at [phone] if you have not heard about the biopsies in 3 weeks.    SIGNATURES/CONFIDENTIALITY: You and/or your care partner have signed  paperwork which will be entered into your electronic medical record.  These signatures attest to the fact that that the information above on your After Visit Summary has been reviewed and is understood.  Full responsibility of the confidentiality of this discharge information lies with you and/or your care-partner.

## 2020-04-12 ENCOUNTER — Other Ambulatory Visit: Payer: Self-pay

## 2020-04-12 ENCOUNTER — Encounter: Payer: Self-pay | Admitting: Cardiology

## 2020-04-12 ENCOUNTER — Telehealth: Payer: Self-pay | Admitting: *Deleted

## 2020-04-12 ENCOUNTER — Ambulatory Visit: Payer: Medicare Other | Admitting: Cardiology

## 2020-04-12 VITALS — BP 177/91 | HR 64 | Resp 17 | Ht 66.0 in | Wt 154.0 lb

## 2020-04-12 DIAGNOSIS — R931 Abnormal findings on diagnostic imaging of heart and coronary circulation: Secondary | ICD-10-CM | POA: Insufficient documentation

## 2020-04-12 DIAGNOSIS — I2510 Atherosclerotic heart disease of native coronary artery without angina pectoris: Secondary | ICD-10-CM | POA: Insufficient documentation

## 2020-04-12 DIAGNOSIS — I10 Essential (primary) hypertension: Secondary | ICD-10-CM

## 2020-04-12 MED ORDER — RAMIPRIL 5 MG PO CAPS: 5.0000 mg | ORAL_CAPSULE | Freq: Every day | ORAL | 2 refills | Status: DC

## 2020-04-12 NOTE — Telephone Encounter (Signed)
  Follow up Call-  Call back number 04/07/2020  Post procedure Call Back phone  # [phone]  Permission to leave phone message Yes  Some recent data might be hidden     Patient questions:  Do you have a fever, pain , or abdominal swelling? No. Pain Score  0 *  Have you tolerated food without any problems? Yes.    Have you been able to return to your normal activities? Yes.    Do you have any questions about your discharge instructions: Diet   No. Medications  No. Follow up visit  No.  Do you have questions or concerns about your Care? No.  Actions: * If pain score is 4 or above: No action needed, pain <4. . 1. Have you developed a fever since your procedure? No   2.   Have you had an respiratory symptoms (SOB or cough) since your procedure? no  3.   Have you tested positive for COVID 19 since your procedure no  4.   Have you had any family members/close contacts diagnosed with the COVID 19 since your procedure?  no   If yes to any of these questions please route to Joylene John, RN and Erenest Rasher, RN

## 2020-04-12 NOTE — Progress Notes (Signed)
Patient referred by Crist Infante, MD for elevated calcium score  Subjective:   Colin Garrett, male    DOB: 07/05/1946, 74 y.o.   MRN: [id number]   Chief Complaint  Patient presents with  . Coronary Artery Disease  . Elevated calcium score    HPI  74 y.o. Caucasian male with hypertension, hyperlipidemia, family history of early CAD, elevated calcium score  Patient recently underwent CT cardiac scoring for stratification given his strong family history.  Calcium score was elevated, details below.  Patient walks 2 miles at 20 mph pace without any chest pain or shortness of breath.  However, he does endorse feeling "fatigued" walking up the hill.  His blood pressure is usually better controlled, mostly runs around 130/80.  However, lately, he has seen occasional numbers of 160/90.  Cholesterol is very well controlled.  He is currently practicing keto diet.   Past Medical History:  Diagnosis Date  . Allergy   . Depression   . GERD (gastroesophageal reflux disease)   . Hx of adenomatous colonic polyps 05/15/2015  . Hyperlipidemia   . Hyperplasia of prostate   . Hypertension      Past Surgical History:  Procedure Laterality Date  . COLONOSCOPY     Multiple  . ESOPHAGOGASTRODUODENOSCOPY     Multiple  . INGUINAL HERNIA REPAIR Left   . UPPER GASTROINTESTINAL ENDOSCOPY       Social History   Tobacco Use  Smoking Status Former Smoker  . Packs/day: 1.00  . Years: 30.00  . Pack years: 30.00  . Types: Cigars, Cigarettes  . Quit date: 10/07/2004  . Years since quitting: 15.5  Smokeless Tobacco Never Used    Social History   Substance and Sexual Activity  Alcohol Use Yes  . Alcohol/week: 0.0 standard drinks   Comment: Pt drinks 1-2 drinks a day     Family History  Problem Relation Age of Onset  . Prostate cancer Brother 14  . Heart disease Mother        Died at age 56  . Heart disease Father        Died at age 51  . Colon cancer Neg Hx   . Colon polyps Neg Hx   .  Esophageal cancer Neg Hx   . Gallbladder disease Neg Hx   . Kidney disease Neg Hx      Current Outpatient Medications on File Prior to Visit  Medication Sig Dispense Refill  . aspirin 81 MG tablet Take 81 mg by mouth daily.    Marland Kitchen atorvastatin (LIPITOR) 40 MG tablet Take 40 mg by mouth daily.    Marland Kitchen ezetimibe (ZETIA) 10 MG tablet Take 10 mg by mouth daily.    . metoprolol succinate (TOPROL-XL) 25 MG 24 hr tablet Take 25 mg by mouth daily.    . Multiple Vitamins-Minerals (MULTIVITAMIN WITH MINERALS) tablet Take 1 tablet by mouth daily.    Marland Kitchen PANTOPRAZOLE SODIUM PO Take 40 mg by mouth daily.     . ramipril (ALTACE) 2.5 MG capsule Take 2.5 mg by mouth daily.    Marland Kitchen triamcinolone (NASACORT ALLERGY 24HR) 55 MCG/ACT AERO nasal inhaler Place 1 spray into the nose daily.     No current facility-administered medications on file prior to visit.    Cardiovascular and other pertinent studies:  EKG 04/12/2020:  Sinus rhythm 71 bpm Nonspecific QRS widening  CT cardiac scoring 02/17/2020: Left Main: 0 LAD: 188 LCx: 60.2 RCA: 5.8  Total Agatston Score: 254 MESA database percentile: 57  Recent labs: 01/25/2020: Glucose 99. BUN/Cr 18/0.8. EGFR 95. Na/K 138/4.7. Rest of the CMP normal HH 17/57. MCV 99, Platelets 229 HbA1C N/A Chol 138, TG 67, HDL 65, LDL 60 TSH 1.2 normal    Review of Systems  Constitutional: Positive for malaise/fatigue (While walking uphill).  Cardiovascular: Negative for chest pain, dyspnea on exertion, leg swelling, palpitations and syncope.         Vitals:   04/12/20 1251 04/12/20 1257  BP: (!) 174/93 (!) 177/91  Pulse: 74 64  Resp: 17   SpO2: 98% 97%     Body mass index is 24.86 kg/m. Filed Weights   04/12/20 1251  Weight: 154 lb (69.9 kg)     Objective:   Physical Exam Vitals and nursing note reviewed.  Constitutional:      General: He is not in acute distress. Neck:     Vascular: No JVD.  Cardiovascular:     Rate and Rhythm: Normal rate and  regular rhythm.     Pulses: Normal pulses.     Heart sounds: Normal heart sounds. No murmur heard.   Pulmonary:     Effort: Pulmonary effort is normal.     Breath sounds: Normal breath sounds. No wheezing or rales.          Assessment & Recommendations:   74 y.o. Caucasian male with hypertension, hyperlipidemia, family history of early CAD, elevated calcium score  Elevated calcium score:  No clear angina symptoms, although he feels fatigued while walking uphill. Ca score 254, 0 in LM. 50 percentile for age and gender. Continue aspirin 81 mg, aggressive risk factor modification, as discussed below. Recommend exercise nuclear stress test for stratification.  Hold metoprolol day before the stress test.    Hypertension: Blood pressure elevated today.  Increase ramipril to 5 mg daily.  Encourage patient to maintain blood pressure log and bring it to next visit.  If remains > 140/90 mmHg, will increase metoprolol succinate to 50 mg daily or add amlodipine.  Hyperlipidemia: Excellent control on Lipitor 40 mg, Zetia 10 mg daily.  Diet & Lifestyle recommendations:  Physical activity recommendation (The Physical Activity Guidelines for Americans. JAMA 2018;Nov 12) At least 150-300 minutes a week of moderate-intensity, or 75-150 minutes a week of vigorous-intensity aerobic physical activity, or an equivalent combination of moderate- and vigorous-intensity aerobic activity. Adults should perform muscle-strengthening activities on 2 or more days a week. Older adults should do multicomponent physical activity that includes balance training as well as aerobic and muscle-strengthening activities. Benefits of increased physical activity include lower risk of mortality including cardiovascular mortality, lower risk of cardiovascular events and associated risk factors (hypertension and diabetes), and lower risk of many cancers (including bladder, breast, colon, endometrium, esophagus, kidney, lung, and  stomach). Additional improvments have been seen in cognition, risk of dementia, anxiety and depression, improved bone health, lower risk of falls, and associated injuries.  Dietary recommendation The 2019 ACC/AHA guidelines promote nutrition as a main fixture of cardiovascular wellness, with a recommendation for a varied diet of fruit, vegetables, fish, legumes, and whole grains (Class I), as well as recommendations to reduce sodium, cholesterol, processed meats, and refined sugars (Class IIa recommendation).10 Sodium intake, a topic of some controversy as of late, is recommended to be kept at 1,500 mg/day or less, far below the average daily intake in the [place] of 3,409 mg/day, and notably below that of previous [place] recommendations for <2,349m/day.10,11 For those unable to reach 1,500 mg/day, they recommend at least a reduction of  1000 mg/day.  A Pesco-Mediterranean Diet With Intermittent Fasting: JACC Review Topic of the Week. J Am Coll Cardiol 5885;02:7741-2878 Pesco-Mediterranean diet, it is supplemented with extra-virgin olive oil (EVOO), which is the principle fat source, along with moderate amounts of dairy (particularly yogurt and cheese) and eggs, as well as modest amounts of alcohol consumption (ideally red wine with the evening meal), but few red and processed meats.   Thank you for referring the patient to [place]. Please feel free to contact with any questions.  Nigel Mormon, MD [facility] Pager: [phone] Office: [phone]

## 2020-04-14 ENCOUNTER — Encounter: Payer: Self-pay | Admitting: Internal Medicine

## 2020-04-14 DIAGNOSIS — R972 Elevated prostate specific antigen [PSA]: Secondary | ICD-10-CM | POA: Diagnosis not present

## 2020-04-19 ENCOUNTER — Other Ambulatory Visit: Payer: Self-pay

## 2020-04-19 ENCOUNTER — Ambulatory Visit: Payer: Medicare Other

## 2020-04-19 DIAGNOSIS — I2510 Atherosclerotic heart disease of native coronary artery without angina pectoris: Secondary | ICD-10-CM

## 2020-04-21 DIAGNOSIS — R972 Elevated prostate specific antigen [PSA]: Secondary | ICD-10-CM | POA: Diagnosis not present

## 2020-04-21 DIAGNOSIS — R3912 Poor urinary stream: Secondary | ICD-10-CM | POA: Diagnosis not present

## 2020-04-21 DIAGNOSIS — N401 Enlarged prostate with lower urinary tract symptoms: Secondary | ICD-10-CM | POA: Diagnosis not present

## 2020-05-01 ENCOUNTER — Ambulatory Visit: Payer: Medicare Other

## 2020-05-01 ENCOUNTER — Other Ambulatory Visit: Payer: Self-pay

## 2020-05-01 DIAGNOSIS — I2510 Atherosclerotic heart disease of native coronary artery without angina pectoris: Secondary | ICD-10-CM | POA: Diagnosis not present

## 2020-05-11 ENCOUNTER — Ambulatory Visit: Payer: Medicare Other | Admitting: Cardiology

## 2020-05-19 ENCOUNTER — Ambulatory Visit: Payer: Medicare Other | Admitting: Cardiology

## 2020-05-19 ENCOUNTER — Other Ambulatory Visit: Payer: Self-pay

## 2020-05-19 ENCOUNTER — Encounter: Payer: Self-pay | Admitting: Cardiology

## 2020-05-19 VITALS — BP 159/75 | HR 83 | Resp 17 | Ht 66.0 in | Wt 152.0 lb

## 2020-05-19 DIAGNOSIS — I2510 Atherosclerotic heart disease of native coronary artery without angina pectoris: Secondary | ICD-10-CM | POA: Diagnosis not present

## 2020-05-19 DIAGNOSIS — R931 Abnormal findings on diagnostic imaging of heart and coronary circulation: Secondary | ICD-10-CM | POA: Diagnosis not present

## 2020-05-19 DIAGNOSIS — I10 Essential (primary) hypertension: Secondary | ICD-10-CM

## 2020-05-19 NOTE — Progress Notes (Signed)
Patient referred by Crist Infante, MD for elevated calcium score  Subjective:   Colin Garrett, male    DOB: 1946-05-29, 75 y.o.   MRN: [id number]   Chief Complaint  Patient presents with   Coronary Artery Disease   Follow-up    4 week    HPI  74 y.o. Caucasian male with hypertension, hyperlipidemia, CAD  Patient underwent stress test given elevated calcium score. He had EKG abnormalities, but had no ischemia on SPECT imaging. He denies chest pain, shortness of breath, palpitations, leg edema, orthopnea, PND, TIA/syncope. Blood pressure elevated today. However, home log shows normal blood pressures.     Current Outpatient Medications on File Prior to Visit  Medication Sig Dispense Refill   aspirin 81 MG tablet Take 81 mg by mouth daily.     atorvastatin (LIPITOR) 40 MG tablet Take 40 mg by mouth daily.     ezetimibe (ZETIA) 10 MG tablet Take 10 mg by mouth daily.     metoprolol succinate (TOPROL-XL) 25 MG 24 hr tablet Take 25 mg by mouth daily.     Multiple Vitamins-Minerals (MULTIVITAMIN WITH MINERALS) tablet Take 1 tablet by mouth daily.     PANTOPRAZOLE SODIUM PO Take 40 mg by mouth daily.      ramipril (ALTACE) 5 MG capsule Take 1 capsule (5 mg total) by mouth daily. 30 capsule 2   triamcinolone (NASACORT ALLERGY 24HR) 55 MCG/ACT AERO nasal inhaler Place 1 spray into the nose daily.     No current facility-administered medications on file prior to visit.    Cardiovascular and other pertinent studies:  Exercise tetrofosmin stress test  05/01/2020: Abnormal ECG stress.  Exercise was terminated due to fatigue/weakness.  Resting EKG demonstrated normal sinus rhythm. Observed intraventricular conduction delay. Peak EKG revealed 2 mm down-sloping ST depression present. During exercise the peak ECG revealed no arrhythmias. The calculated Duke Treadmill Score is -2.52. The patient exercised for 7 minutes and 29 seconds of a Bruce protocol, achieving approximately 9.34  METs.  Normal BP response. Myocardial perfusion is normal. Overall LV systolic function is normal without regional wall motion abnormalities. Stress LV EF: 63%.  No previous exam available for comparison. Intermediate risk study due to abnormal EKG response.   EKG 04/12/2020:  Sinus rhythm 71 bpm Nonspecific QRS widening  CT cardiac scoring 02/17/2020: Left Main: 0 LAD: 188 LCx: 60.2 RCA: 5.8  Total Agatston Score: 254 MESA database percentile: 57  Recent labs: 01/25/2020: Glucose 99. BUN/Cr 18/0.8. EGFR 95. Na/K 138/4.7. Rest of the CMP normal HH 17/57. MCV 99, Platelets 229 HbA1C N/A Chol 138, TG 67, HDL 65, LDL 60 TSH 1.2 normal    Review of Systems  Constitutional: Positive for malaise/fatigue (While walking uphill).  Cardiovascular: Negative for chest pain, dyspnea on exertion, leg swelling, palpitations and syncope.         Vitals:   05/19/20 1507  BP: (!) 159/75  Pulse: 83  Resp: 17  SpO2: 96%     Body mass index is 24.53 kg/m. Filed Weights   05/19/20 1507  Weight: 152 lb (68.9 kg)     Objective:   Physical Exam Vitals and nursing note reviewed.  Constitutional:      General: He is not in acute distress. Neck:     Vascular: No JVD.  Cardiovascular:     Rate and Rhythm: Normal rate and regular rhythm.     Pulses: Normal pulses.     Heart sounds: Normal heart sounds. No murmur heard.  Pulmonary:     Effort: Pulmonary effort is normal.     Breath sounds: Normal breath sounds. No wheezing or rales.          Assessment & Recommendations:   74 y.o. Caucasian male with hypertension, hyperlipidemia, CAD  CAD: Elevated calcium score: EKG abnormalities without ischemia on SPECT imaging.  Continue aggressive risk factor modification. Continue Aspirin 81 mg, lipitor 40 mg, Zetia 10 mg, metoprolol, ramipril.  Hypertension: Blood pressure elevated today, but very well controlled at home. No change made today to his antihypertensive  regimen.  Hyperlipidemia: Excellent control on Lipitor 40 mg, Zetia 10 mg daily.  F/u in 6 months.   Nigel Mormon, MD [facility]. [facility] Pager: [phone] Office: [phone]

## 2020-05-20 ENCOUNTER — Encounter: Payer: Self-pay | Admitting: Cardiology

## 2020-05-31 ENCOUNTER — Other Ambulatory Visit: Payer: Self-pay | Admitting: Cardiology

## 2020-05-31 DIAGNOSIS — I10 Essential (primary) hypertension: Secondary | ICD-10-CM

## 2020-06-19 DIAGNOSIS — Z23 Encounter for immunization: Secondary | ICD-10-CM | POA: Diagnosis not present

## 2020-07-04 DIAGNOSIS — H5310 Unspecified subjective visual disturbances: Secondary | ICD-10-CM | POA: Diagnosis not present

## 2020-07-05 DIAGNOSIS — Z23 Encounter for immunization: Secondary | ICD-10-CM | POA: Diagnosis not present

## 2020-07-21 ENCOUNTER — Telehealth: Payer: Self-pay | Admitting: Internal Medicine

## 2020-07-21 NOTE — Telephone Encounter (Signed)
Mr Daughtry sent a message via My Chart: "Need Dr. Celesta Aver advice as to whether I need endoscopy for Schatzki Ring. "

## 2020-07-21 NOTE — Telephone Encounter (Signed)
Question sent to the patient via mychart

## 2020-07-24 NOTE — Telephone Encounter (Signed)
Patient reports that he has not had any testing or problems.  Patient notified of response.  He will call back for any additional questions or concerns.

## 2020-07-24 NOTE — Telephone Encounter (Signed)
An asymptomatic Schatzki ring does not need an endoscopy  He has had EGD and dilation in the past and is on PPI or should be.  Last time with me was in 2016 when I dilated a "ringlike stricture".  Has he had other testing recently?

## 2020-07-24 NOTE — Telephone Encounter (Signed)
Patient does not have any difficulty swallowing.  Do you feel he needs EGD?

## 2020-10-26 DIAGNOSIS — R972 Elevated prostate specific antigen [PSA]: Secondary | ICD-10-CM | POA: Diagnosis not present

## 2020-11-20 ENCOUNTER — Ambulatory Visit: Payer: Medicare Other | Admitting: Cardiology

## 2020-12-22 DIAGNOSIS — H25013 Cortical age-related cataract, bilateral: Secondary | ICD-10-CM | POA: Diagnosis not present

## 2020-12-22 DIAGNOSIS — H5310 Unspecified subjective visual disturbances: Secondary | ICD-10-CM | POA: Diagnosis not present

## 2020-12-27 DIAGNOSIS — E782 Mixed hyperlipidemia: Secondary | ICD-10-CM | POA: Insufficient documentation

## 2020-12-27 NOTE — Progress Notes (Signed)
Patient referred by Crist Infante, MD for elevated calcium score  Subjective:   Colin Garrett, male    DOB: 05/11/1946, 75 y.o.   MRN: [id number]   Chief Complaint  Patient presents with   Coronary Artery Disease   Chest Pain   Follow-up    HPI  75 y.o. Caucasian male with hypertension, hyperlipidemia, CAD  Patient reports episodes of burning pain in right side of chest, seem to occurs at rest.  Usually does not occur with exertion.  He has ongoing work-up for Schatzki's rings with gastroenterologist, with plan for EGD and dilatation in the near future.  Blood pressure elevated today, but usually normal at home.   Current Outpatient Medications on File Prior to Visit  Medication Sig Dispense Refill   aspirin 81 MG tablet Take 81 mg by mouth daily.     atorvastatin (LIPITOR) 40 MG tablet Take 40 mg by mouth daily.     ezetimibe (ZETIA) 10 MG tablet Take 10 mg by mouth daily.     metoprolol succinate (TOPROL-XL) 25 MG 24 hr tablet Take 25 mg by mouth daily.     Multiple Vitamins-Minerals (MULTIVITAMIN WITH MINERALS) tablet Take 1 tablet by mouth daily.     PANTOPRAZOLE SODIUM PO Take 40 mg by mouth daily.      ramipril (ALTACE) 5 MG capsule Take 1 capsule (5 mg total) by mouth daily. 90 capsule 2   triamcinolone (NASACORT ALLERGY 24HR) 55 MCG/ACT AERO nasal inhaler Place 1 spray into the nose daily.     No current facility-administered medications on file prior to visit.    Cardiovascular and other pertinent studies:  EKG 12/28/2020: Sinus rhythm 74 bpm IVCD  Exercise tetrofosmin stress test  05/01/2020: Abnormal ECG stress.  Exercise was terminated due to fatigue/weakness.  Resting EKG demonstrated normal sinus rhythm. Observed intraventricular conduction delay. Peak EKG revealed 2 mm down-sloping ST depression present. During exercise the peak ECG revealed no arrhythmias. The calculated Duke Treadmill Score is -2.52. The patient exercised for 7 minutes and 29  seconds of a Bruce protocol, achieving approximately 9.34 METs.  Normal BP response. Myocardial perfusion is normal. Overall LV systolic function is normal without regional wall motion abnormalities. Stress LV EF: 63%.  No previous exam available for comparison. Intermediate risk study due to abnormal EKG response.   EKG 04/12/2020:  Sinus rhythm 71 bpm Nonspecific QRS widening  CT cardiac scoring 02/17/2020: Total Agatston Score: 254 MESA database percentile: 57 Left Main: 0 LAD: 188 LCx: 60.2 RCA: 5.8  Total Agatston Score: 254 MESA database percentile: 57  Recent labs: 01/25/2020: Glucose 99. BUN/Cr 18/0.8. EGFR 95. Na/K 138/4.7. Rest of the CMP normal HH 17/57. MCV 99, Platelets 229 HbA1C N/A Chol 138, TG 67, HDL 65, LDL 60 TSH 1.2 normal    Review of Systems  Constitutional: Positive for malaise/fatigue (While walking uphill).  Cardiovascular: Negative for chest pain, dyspnea on exertion, leg swelling, palpitations and syncope.         Vitals:   12/28/20 1251 12/28/20 1301  BP: (!) 161/80 (!) 151/75  Pulse: 76 77  Temp: 98.2 F (36.8 C)   SpO2: 97% 98%     Body mass index is 25.34 kg/m. Filed Weights   12/28/20 1251  Weight: 157 lb (71.2 kg)     Objective:   Physical Exam Vitals and nursing note reviewed.  Constitutional:      General: He is not in acute distress. Neck:     Vascular: No JVD.  Cardiovascular:     Rate and Rhythm: Normal rate and regular rhythm.     Pulses: Normal pulses.     Heart sounds: Normal heart sounds. No murmur heard.   Pulmonary:     Effort: Pulmonary effort is normal.     Breath sounds: Normal breath sounds. No wheezing or rales.          Assessment & Recommendations:   75 y.o. Caucasian male with hypertension, hyperlipidemia, CAD  CAD: Atypical chest pain symptoms less likely to be angina.  However, he does have coronary artery disease seen on CT scan.  Recommend increase metoprolol succinate 50 mg daily.   If his symptoms of chest pain do not improve with EGD and dilatation, could consider repeat ischemic evaluation.   Continue Aspirin 81 mg, lipitor 40 mg, Zetia 10 mg, metoprolol, ramipril, SL NTG prn  Hypertension: Blood pressure elevated today, but very well controlled at home. Nonetheless, increasing metoprolol should help.  Mixed hyperlipidemia: Excellent control on Lipitor 40 mg, Zetia 10 mg daily.  F/u in 3 months  [place], MD [facility] Pager: [phone] Office: [phone]

## 2020-12-28 ENCOUNTER — Ambulatory Visit: Payer: Medicare Other | Admitting: Cardiology

## 2020-12-28 ENCOUNTER — Encounter: Payer: Self-pay | Admitting: Cardiology

## 2020-12-28 ENCOUNTER — Other Ambulatory Visit: Payer: Self-pay

## 2020-12-28 VITALS — BP 151/75 | HR 77 | Temp 98.2°F | Ht 66.0 in | Wt 157.0 lb

## 2020-12-28 DIAGNOSIS — I10 Essential (primary) hypertension: Secondary | ICD-10-CM | POA: Diagnosis not present

## 2020-12-28 DIAGNOSIS — E782 Mixed hyperlipidemia: Secondary | ICD-10-CM

## 2020-12-28 DIAGNOSIS — R931 Abnormal findings on diagnostic imaging of heart and coronary circulation: Secondary | ICD-10-CM

## 2020-12-28 DIAGNOSIS — I2510 Atherosclerotic heart disease of native coronary artery without angina pectoris: Secondary | ICD-10-CM | POA: Diagnosis not present

## 2020-12-28 MED ORDER — METOPROLOL SUCCINATE ER 50 MG PO TB24: 50.0000 mg | ORAL_TABLET | Freq: Every day | ORAL | 3 refills | Status: DC

## 2020-12-28 MED ORDER — NITROGLYCERIN 0.4 MG SL SUBL: 0.4000 mg | SUBLINGUAL_TABLET | SUBLINGUAL | 3 refills | Status: DC | PRN

## 2021-01-03 ENCOUNTER — Other Ambulatory Visit: Payer: Self-pay

## 2021-01-03 ENCOUNTER — Encounter: Payer: Self-pay | Admitting: Internal Medicine

## 2021-01-03 ENCOUNTER — Ambulatory Visit (INDEPENDENT_AMBULATORY_CARE_PROVIDER_SITE_OTHER): Payer: Medicare Other | Admitting: Internal Medicine

## 2021-01-03 VITALS — BP 152/82 | HR 68 | Ht 65.5 in | Wt 155.0 lb

## 2021-01-03 DIAGNOSIS — Q676 Pectus excavatum: Secondary | ICD-10-CM

## 2021-01-03 DIAGNOSIS — R931 Abnormal findings on diagnostic imaging of heart and coronary circulation: Secondary | ICD-10-CM | POA: Diagnosis not present

## 2021-01-03 DIAGNOSIS — K222 Esophageal obstruction: Secondary | ICD-10-CM | POA: Diagnosis not present

## 2021-01-03 DIAGNOSIS — K219 Gastro-esophageal reflux disease without esophagitis: Secondary | ICD-10-CM

## 2021-01-03 DIAGNOSIS — R079 Chest pain, unspecified: Secondary | ICD-10-CM

## 2021-01-03 IMAGING — CT CT CARDIAC CORONARY ARTERY CALCIUM SCORE
3 series · 13 of 20 positions shown, 15 images · non-contrast
Comparison: None.

CLINICAL DATA: Hypertension

EXAM:
CT CARDIAC CORONARY ARTERY CALCIUM SCORE
TECHNIQUE: Non-contrast imaging through the heart was performed using
prospective ECG gating. Image post processing was performed on an
independent workstation, allowing for quantitative analysis of the
heart and coronary arteries. Note that this exam targets the heart
and the chest was not imaged in its entirety.

[Series 2: calcium scoring 2.00 qr36 bestdiast 70% hrt calciu · axial · 0.39mm/px · z∈[+1627,+1683]mm · 3 of 70 slices shown]
[im 14/70  vessel]
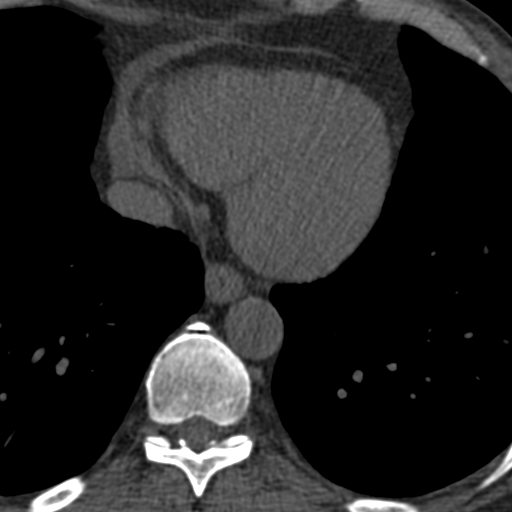
[im 28/70  vessel]
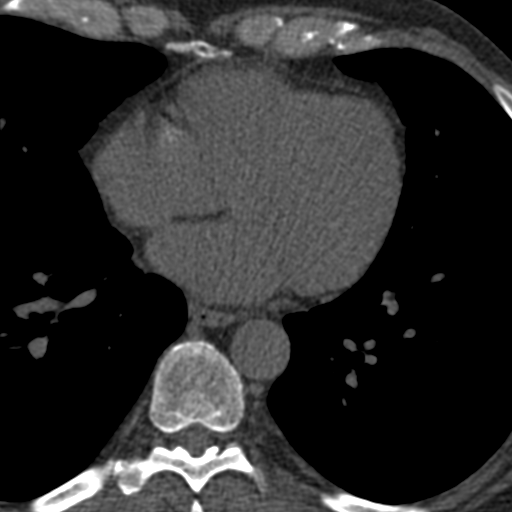
[im 42/70  vessel]
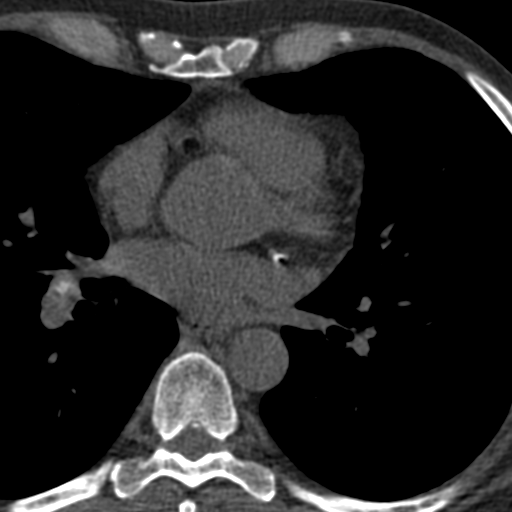

[Series 3: calcium scoring 2.00 br40 bestdiast 70% axial · axial · 0.53mm/px · z∈[+1623,+1715]mm · 5 of 70 slices shown, 7 images]
[im 12/70  vessel]
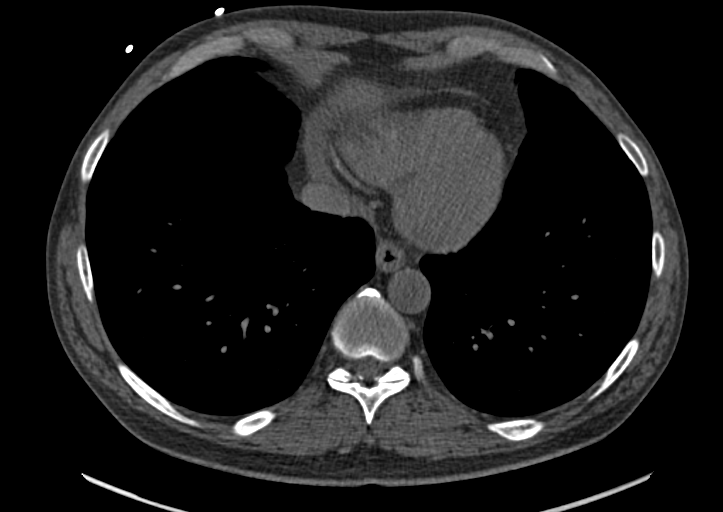
[im 12/70  lung]
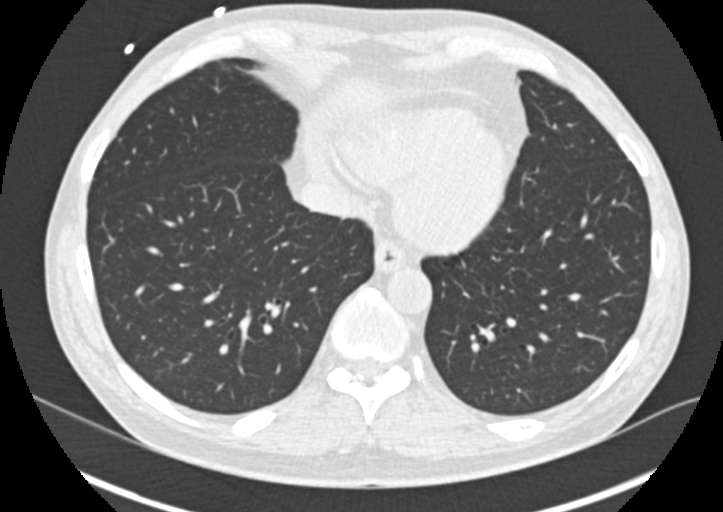
[im 24/70  vessel]
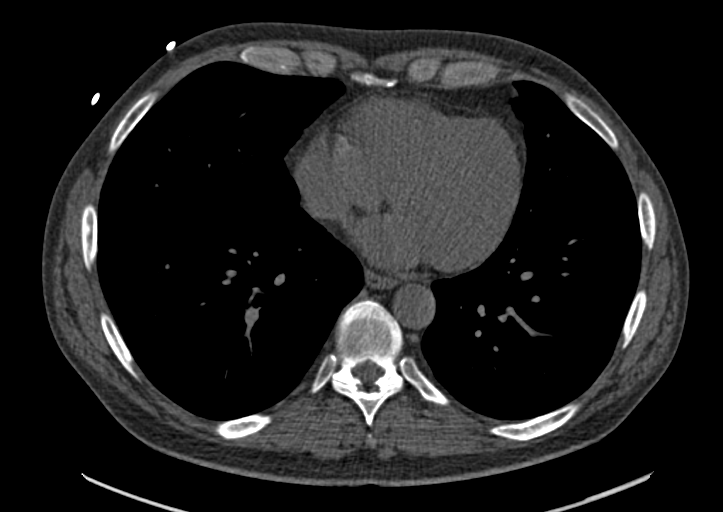
[im 35/70  vessel]
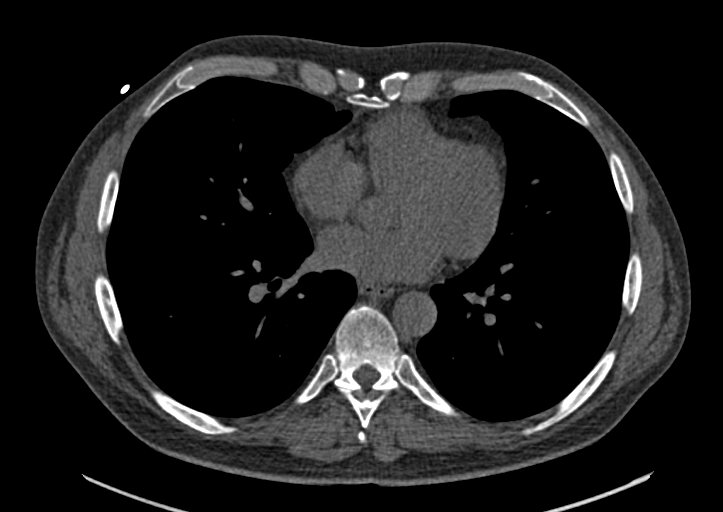
[im 47/70  vessel]
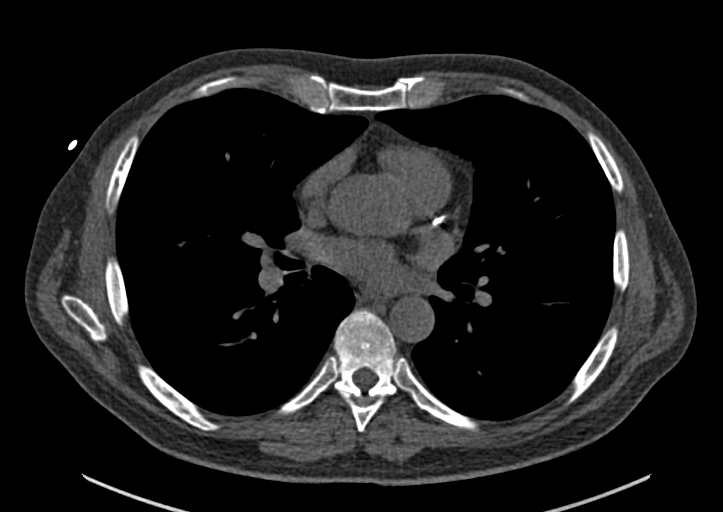
[im 58/70  vessel]
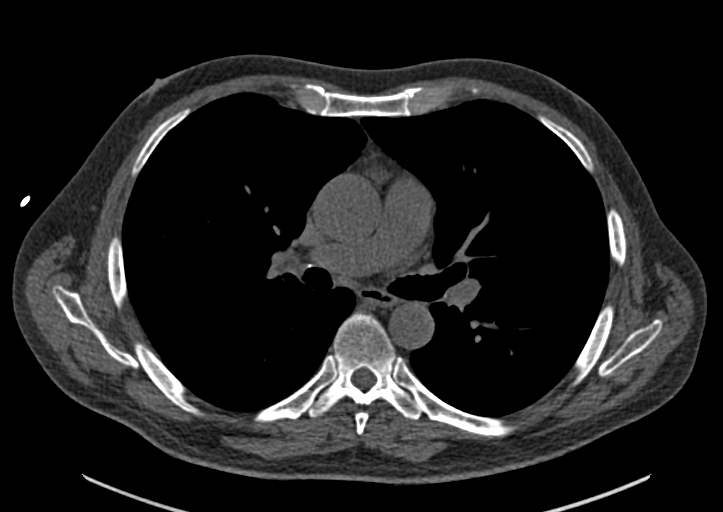
[im 58/70  lung]
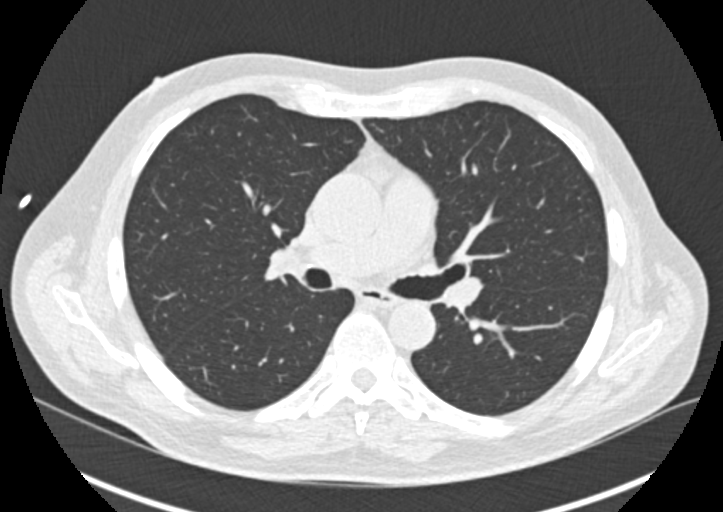

[Series 9: calcium scoring 2.00 br60 bestdiast 70% lungs · axial · 0.53mm/px · z∈[+1623,+1715]mm · 5 of 70 slices shown]
[im 12/70  vessel]
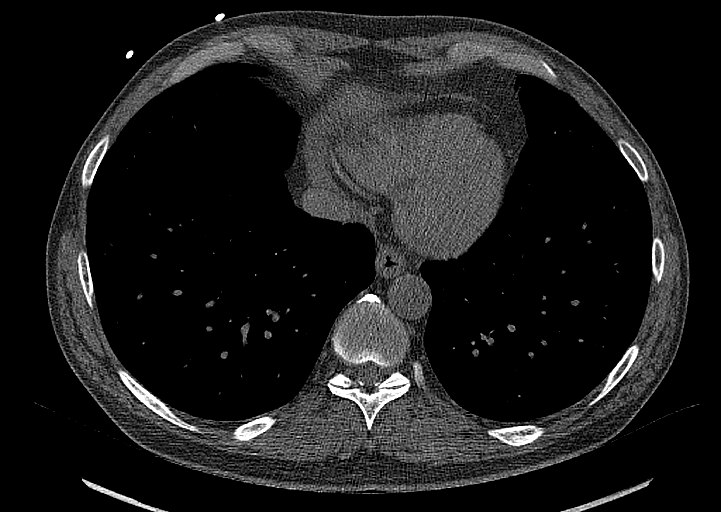
[im 24/70  vessel]
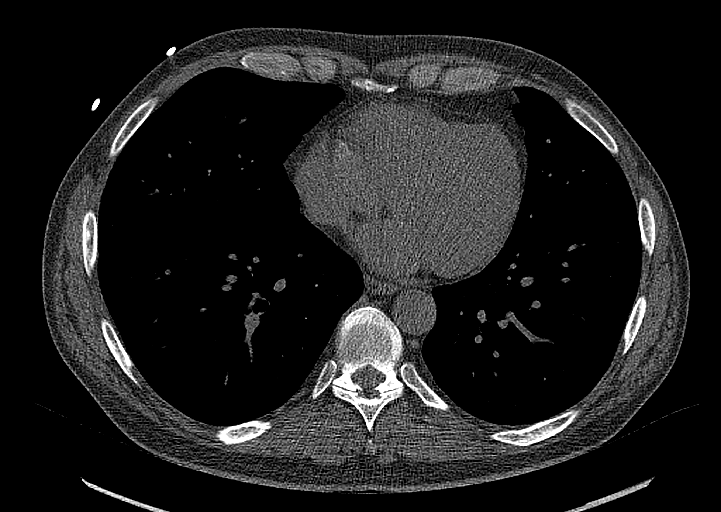
[im 35/70  vessel]
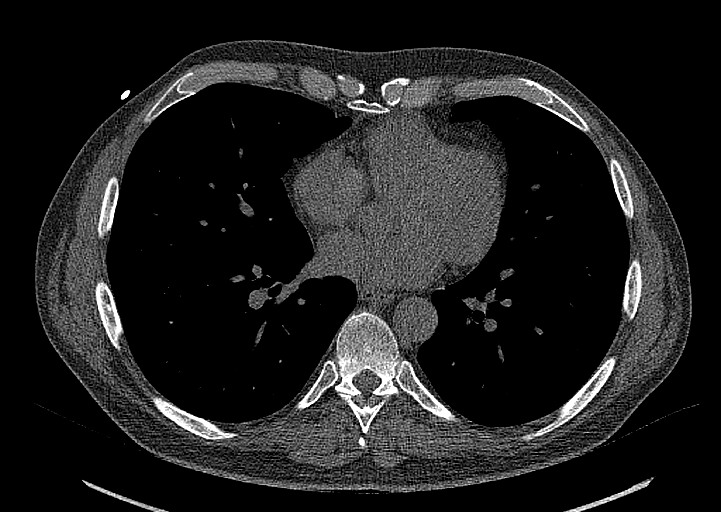
[im 47/70  vessel]
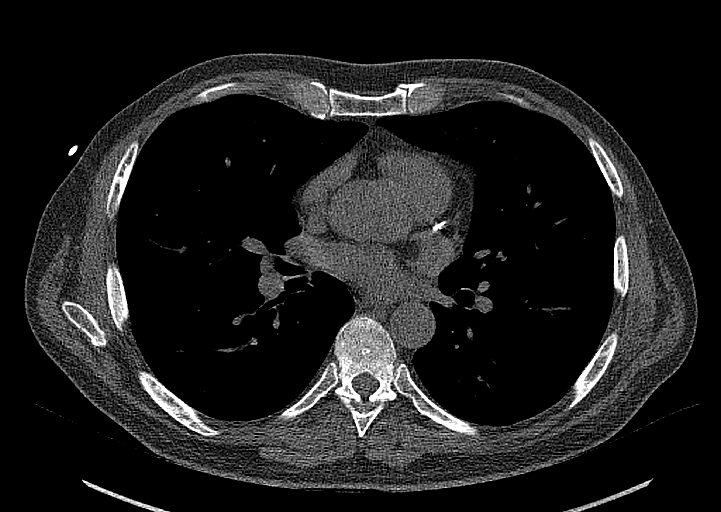
[im 58/70  vessel]
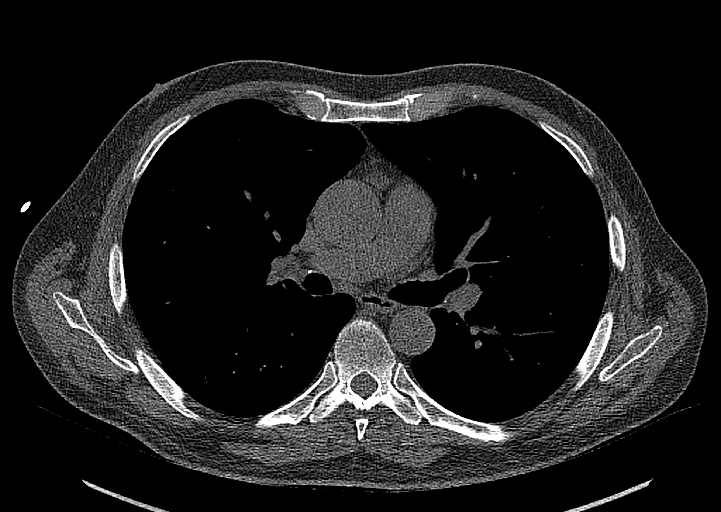

[13 of 20 positions shown; findings below may reference images not displayed]

FINDINGS: CORONARY CALCIUM SCORES:

Left Main: 0

LAD: 188

LCx:

RCA:

Total Agatston Score: 254

[HOSPITAL] percentile: 57

AORTA MEASUREMENTS:

Ascending Aorta: 36 mm

Descending Aorta: 24 mm

OTHER FINDINGS:

Heart is normal size. No adenopathy in the lower mediastinum or
hila. No confluent airspace opacities or effusions. Imaging into the
upper abdomen shows no acute findings. Chest wall soft tissues are
unremarkable. No acute bony abnormality.
IMPRESSION: The observed calcium score of 254 is at the percentile 57 for
subjects of the same age, gender and race/ethnicity who are free of
clinical cardiovascular disease and treated diabetes.

No acute or significant extracardiac abnormality

## 2021-01-03 NOTE — Patient Instructions (Signed)
Glad to see you today.  Normal BMI (Body Mass Index- based on height and weight) is between 23 and 30. Your BMI today is Body mass index is 25.4 kg/m. Marland Kitchen Please consider follow up  regarding your BMI with your Primary Care Provider.   If your symptoms return call us and let us know.   I appreciate the opportunity to care for you. Silvano Rusk, MD, [facility]

## 2021-01-03 NOTE — Progress Notes (Signed)
Colin Garrett 75 y.o. 1945-10-17 [id number]  Assessment & Plan:   Encounter Diagnoses  Name Primary?  . Chest pain ? musculoskeletal Yes  . Pectus excavatum - mild   . Lower esophageal ring (Schatzki)   . Gastroesophageal reflux disease, unspecified whether esophagitis present    I do not know what his chest symptoms were caused by but it is reassuring that they are gone.  I will quite have a mechanism to explain why increasing metoprolol might of change this.  Perhaps his pectus excavatum had some relationship some of it does sound musculoskeletal.  I cannot make a case to repeat an endoscopy he is not having dysphagia and he has been faithful on his pantoprazole so we will observe since he feels well at this time and he knows to contact me if this recurs.   I appreciate the opportunity to care for this patient. CC: Crist Infante, MD Dr. Vernell Leep  Subjective:   Chief Complaint: Chest pain  HPI Colin Garrett is a 75 year old white man with a history of dysphagia and a Schatzki ring dilation in 2016, which treated his dysphagia.  Over the past several months he has had some fleeting chest pains and a pressure in the center of his chest and over the left and right anterior chest wall areas.  Vague not very bothersome.  Last year he had a CT calcium test which showed him to be at 57th percentile so primary care sent him to cardiology where he had a stress test that was okay.  He was seen urology weeks ago ago and his metoprolol was increased and he has not had more of these chest symptoms.  They were not associated with eating or food and he does not have dysphagia.  No heartburn.  No weight loss.  He has a remote history of having costochondritis in his teens.  He does not recall any significant pain with lifting or movement etc.  He is back at the request of his cardiologist I think because of a history of esophageal ring and reflux and whether this could be related to his symptoms..  Note  none of his symptoms were exertional.  When he was seen on March 73 by Dr. Vernell Leep his blood pressure was elevated so he doubled his metoprolol XL from 25 to 50 mg.  That was the change that occurred and subsequently no chest symptoms.     Wt Readings from Last 3 Encounters:  01/03/21 155 lb (70.3 kg)  12/28/20 157 lb (71.2 kg)  05/19/20 152 lb (68.9 kg)    No Known Allergies Current Meds  Medication Sig  . aspirin 81 MG tablet Take 81 mg by mouth daily.  Marland Kitchen atorvastatin (LIPITOR) 40 MG tablet Take 40 mg by mouth daily.  Marland Kitchen ezetimibe (ZETIA) 10 MG tablet Take 10 mg by mouth daily.  . metoprolol succinate (TOPROL-XL) 50 MG 24 hr tablet Take 1 tablet (50 mg total) by mouth daily.  . nitroGLYCERIN (NITROSTAT) 0.4 MG SL tablet Place 1 tablet (0.4 mg total) under the tongue every 5 (five) minutes as needed for chest pain.  Marland Kitchen PANTOPRAZOLE SODIUM PO Take 40 mg by mouth daily.   . ramipril (ALTACE) 5 MG capsule Take 1 capsule (5 mg total) by mouth daily.  Marland Kitchen triamcinolone (NASACORT) 55 MCG/ACT AERO nasal inhaler Place 1 spray into the nose daily.   Past Medical History:  Diagnosis Date  . Allergy   . Depression   . GERD (gastroesophageal reflux disease)   .  Hx of adenomatous colonic polyps 05/15/2015  . Hyperlipidemia   . Hyperplasia of prostate   . Hypertension    Past Surgical History:  Procedure Laterality Date  . COLONOSCOPY     Multiple  . ESOPHAGOGASTRODUODENOSCOPY     Multiple  . INGUINAL HERNIA REPAIR Left   . UPPER GASTROINTESTINAL ENDOSCOPY     Social History   Social History Narrative   Married 2 sons 2 daughters   Arts development officer, Chief Strategy Officer, Programme researcher, broadcasting/film/video   Education level: Masters in [employer]   Originally from [place] in [place] since 2013-4, prior to that split time in [place] and [place]   1 caffeine drink daily      family history includes Heart disease in his father and mother; Prostate cancer  (age of onset: 58) in his brother.   Review of Systems As above  Objective:   Physical Exam BP (!) 152/82 (BP Location: Left Arm, Patient Position: Sitting, Cuff Size: Normal)   Pulse 68   Ht 5' 5.5" (1.664 m) Comment: height measured without shoes  Wt 155 lb (70.3 kg)   BMI 25.40 kg/m  Well-developed elderly white man in no acute distress Lung fields are clear posteriorly The heart sounds are normal S1-S2 no rubs murmurs or gallops The chest wall has a mild pectus excavatum deformity it is nontender The abdomen is soft and nontender

## 2021-01-04 DIAGNOSIS — Z23 Encounter for immunization: Secondary | ICD-10-CM | POA: Diagnosis not present

## 2021-02-12 DIAGNOSIS — Z125 Encounter for screening for malignant neoplasm of prostate: Secondary | ICD-10-CM | POA: Diagnosis not present

## 2021-02-12 DIAGNOSIS — E785 Hyperlipidemia, unspecified: Secondary | ICD-10-CM | POA: Diagnosis not present

## 2021-02-12 DIAGNOSIS — D539 Nutritional anemia, unspecified: Secondary | ICD-10-CM | POA: Diagnosis not present

## 2021-02-19 DIAGNOSIS — R7301 Impaired fasting glucose: Secondary | ICD-10-CM | POA: Diagnosis not present

## 2021-02-19 DIAGNOSIS — R82998 Other abnormal findings in urine: Secondary | ICD-10-CM | POA: Diagnosis not present

## 2021-02-19 DIAGNOSIS — E785 Hyperlipidemia, unspecified: Secondary | ICD-10-CM | POA: Diagnosis not present

## 2021-02-19 DIAGNOSIS — Z1389 Encounter for screening for other disorder: Secondary | ICD-10-CM | POA: Diagnosis not present

## 2021-02-19 DIAGNOSIS — Z1212 Encounter for screening for malignant neoplasm of rectum: Secondary | ICD-10-CM | POA: Diagnosis not present

## 2021-02-19 DIAGNOSIS — I2510 Atherosclerotic heart disease of native coronary artery without angina pectoris: Secondary | ICD-10-CM | POA: Diagnosis not present

## 2021-02-19 DIAGNOSIS — Z0000 Encounter for general adult medical examination without abnormal findings: Secondary | ICD-10-CM | POA: Diagnosis not present

## 2021-02-19 DIAGNOSIS — I10 Essential (primary) hypertension: Secondary | ICD-10-CM | POA: Diagnosis not present

## 2021-02-19 DIAGNOSIS — N401 Enlarged prostate with lower urinary tract symptoms: Secondary | ICD-10-CM | POA: Diagnosis not present

## 2021-02-20 DIAGNOSIS — Z23 Encounter for immunization: Secondary | ICD-10-CM | POA: Diagnosis not present

## 2021-03-02 ENCOUNTER — Other Ambulatory Visit: Payer: Self-pay | Admitting: Cardiology

## 2021-03-02 DIAGNOSIS — I10 Essential (primary) hypertension: Secondary | ICD-10-CM

## 2021-03-26 DIAGNOSIS — D539 Nutritional anemia, unspecified: Secondary | ICD-10-CM | POA: Diagnosis not present

## 2021-03-26 DIAGNOSIS — Z13820 Encounter for screening for osteoporosis: Secondary | ICD-10-CM | POA: Diagnosis not present

## 2021-03-26 DIAGNOSIS — M5416 Radiculopathy, lumbar region: Secondary | ICD-10-CM | POA: Diagnosis not present

## 2021-03-30 ENCOUNTER — Ambulatory Visit: Payer: Medicare Other | Admitting: Cardiology

## 2021-04-04 ENCOUNTER — Other Ambulatory Visit: Payer: Self-pay

## 2021-04-04 ENCOUNTER — Encounter: Payer: Self-pay | Admitting: Cardiology

## 2021-04-04 ENCOUNTER — Ambulatory Visit: Payer: Medicare Other | Admitting: Cardiology

## 2021-04-04 VITALS — BP 161/82 | HR 83 | Temp 98.5°F | Resp 17 | Ht 65.5 in | Wt 154.2 lb

## 2021-04-04 DIAGNOSIS — I10 Essential (primary) hypertension: Secondary | ICD-10-CM

## 2021-04-04 DIAGNOSIS — I2510 Atherosclerotic heart disease of native coronary artery without angina pectoris: Secondary | ICD-10-CM

## 2021-04-04 DIAGNOSIS — R931 Abnormal findings on diagnostic imaging of heart and coronary circulation: Secondary | ICD-10-CM | POA: Diagnosis not present

## 2021-04-04 DIAGNOSIS — E782 Mixed hyperlipidemia: Secondary | ICD-10-CM

## 2021-04-04 NOTE — Progress Notes (Signed)
Patient referred by Crist Infante, MD for elevated calcium score  Subjective:   Colin Garrett, male    DOB: 1946-06-09, 75 y.o.   MRN: [id number]   Chief Complaint  Patient presents with   Chest Pain    3 month    HPI  75 y.o. Caucasian male with hypertension, hyperlipidemia, CAD  In 12/2020, patient reported episodes of burning pain in right side of chest, seem to occurs at rest.  Usually does not occur with exertion.  Symptoms have improved with metoprolol as well as pantoprazole. There are no plans for EGD at this time, as per Dr. Carlean Purl. Blood pressure elevated today, but usually normal at home.   Current Outpatient Medications on File Prior to Visit  Medication Sig Dispense Refill   aspirin 81 MG tablet Take 81 mg by mouth daily.     atorvastatin (LIPITOR) 40 MG tablet Take 40 mg by mouth daily.     ezetimibe (ZETIA) 10 MG tablet Take 10 mg by mouth daily.     metoprolol succinate (TOPROL-XL) 50 MG 24 hr tablet Take 1 tablet (50 mg total) by mouth daily. 90 tablet 3   nitroGLYCERIN (NITROSTAT) 0.4 MG SL tablet Place 1 tablet (0.4 mg total) under the tongue every 5 (five) minutes as needed for chest pain. 30 tablet 3   PANTOPRAZOLE SODIUM PO Take 40 mg by mouth daily.      ramipril (ALTACE) 5 MG capsule TAKE ONE CAPSULE BY MOUTH DAILY 90 capsule 2   triamcinolone (NASACORT) 55 MCG/ACT AERO nasal inhaler Place 1 spray into the nose daily.     No current facility-administered medications on file prior to visit.    Cardiovascular and other pertinent studies:  EKG 12/28/2020: Sinus rhythm 74 bpm IVCD  Exercise tetrofosmin stress test  05/01/2020: Abnormal ECG stress.  Exercise was terminated due to fatigue/weakness.  Resting EKG demonstrated normal sinus rhythm. Observed intraventricular conduction delay. Peak EKG revealed 2 mm down-sloping ST depression present. During exercise the peak ECG revealed no arrhythmias. The calculated Duke Treadmill Score is -2.52. The patient  exercised for 7 minutes and 29 seconds of a Bruce protocol, achieving approximately 9.34 METs.  Normal BP response. Myocardial perfusion is normal. Overall LV systolic function is normal without regional wall motion abnormalities. Stress LV EF: 63%.  No previous exam available for comparison. Intermediate risk study due to abnormal EKG response.   EKG 04/12/2020:  Sinus rhythm 71 bpm Nonspecific QRS widening  CT cardiac scoring 02/17/2020: Total Agatston Score: 254 MESA database percentile: 57 Left Main: 0 LAD: 188 LCx: 60.2 RCA: 5.8   Total Agatston Score: 254 MESA database percentile: 57  Recent labs: 01/25/2020: Glucose 99. BUN/Cr 18/0.8. EGFR 95. Na/K 138/4.7. Rest of the CMP normal HH 17/57. MCV 99, Platelets 229 HbA1C N/A Chol 138, TG 67, HDL 65, LDL 60 TSH 1.2 normal    ROS       Vitals:   04/04/21 1251 04/04/21 1257  BP: (!) 163/83 (!) 161/82  Pulse: 91 83  Resp: 17   Temp: 98.5 F (36.9 C)   SpO2: 97% 97%    Body mass index is 25.27 kg/m. Filed Weights   04/04/21 1251  Weight: 154 lb 3.2 oz (69.9 kg)     Objective:   Physical Exam Vitals and nursing note reviewed.  Constitutional:      General: He is not in acute distress. Neck:     Vascular: No JVD.  Cardiovascular:     Rate and  Rhythm: Normal rate and regular rhythm.     Heart sounds: Normal heart sounds. No murmur heard. Pulmonary:     Effort: Pulmonary effort is normal.     Breath sounds: Normal breath sounds. No wheezing or rales.  Musculoskeletal:     Right lower leg: No edema.     Left lower leg: No edema.         Assessment & Recommendations:   75 y.o. Caucasian male with hypertension, hyperlipidemia, CAD  CAD: Atypical chest pain symptoms less likely to be angina.  No ischemia on stress test (04/2020). Given improvement in symptoms, continue current medical therapy.  Continue Aspirin 81 mg, lipitor 40 mg, Zetia 10 mg, metoprolol, ramipril, SL NTG prn F/u in 6 weeks to  reassess symptoms as he increases his physical activity. He is planning a study trip to the [place]. I will see him after his return.   Hypertension: Blood pressure elevated today, but very well controlled at home.  Suspect a component of white coat hypertension. No changes made today.   Mixed hyperlipidemia: Excellent control on Lipitor 40 mg, Zetia 10 mg daily.  F/u in 6 weeks  Mizani Dilday Esther Hardy, MD [facility] Pager: [phone] Office: [phone]

## 2021-04-18 DIAGNOSIS — R6882 Decreased libido: Secondary | ICD-10-CM | POA: Diagnosis not present

## 2021-04-18 DIAGNOSIS — D539 Nutritional anemia, unspecified: Secondary | ICD-10-CM | POA: Diagnosis not present

## 2021-04-18 DIAGNOSIS — M810 Age-related osteoporosis without current pathological fracture: Secondary | ICD-10-CM | POA: Diagnosis not present

## 2021-05-25 DIAGNOSIS — E538 Deficiency of other specified B group vitamins: Secondary | ICD-10-CM | POA: Diagnosis not present

## 2021-05-25 DIAGNOSIS — E785 Hyperlipidemia, unspecified: Secondary | ICD-10-CM | POA: Diagnosis not present

## 2021-06-01 DIAGNOSIS — R972 Elevated prostate specific antigen [PSA]: Secondary | ICD-10-CM | POA: Diagnosis not present

## 2021-06-04 ENCOUNTER — Ambulatory Visit: Payer: Medicare Other | Admitting: Cardiology

## 2021-06-08 DIAGNOSIS — R351 Nocturia: Secondary | ICD-10-CM | POA: Diagnosis not present

## 2021-06-08 DIAGNOSIS — N401 Enlarged prostate with lower urinary tract symptoms: Secondary | ICD-10-CM | POA: Diagnosis not present

## 2021-06-08 DIAGNOSIS — R972 Elevated prostate specific antigen [PSA]: Secondary | ICD-10-CM | POA: Diagnosis not present

## 2021-06-08 DIAGNOSIS — R3912 Poor urinary stream: Secondary | ICD-10-CM | POA: Diagnosis not present

## 2021-06-13 ENCOUNTER — Encounter: Payer: Self-pay | Admitting: Cardiology

## 2021-06-13 ENCOUNTER — Other Ambulatory Visit: Payer: Self-pay

## 2021-06-13 ENCOUNTER — Ambulatory Visit: Payer: Medicare Other | Admitting: Cardiology

## 2021-06-13 VITALS — BP 147/75 | HR 69 | Temp 98.0°F | Ht 65.5 in | Wt 156.8 lb

## 2021-06-13 DIAGNOSIS — I10 Essential (primary) hypertension: Secondary | ICD-10-CM | POA: Diagnosis not present

## 2021-06-13 DIAGNOSIS — R931 Abnormal findings on diagnostic imaging of heart and coronary circulation: Secondary | ICD-10-CM | POA: Diagnosis not present

## 2021-06-13 DIAGNOSIS — I2510 Atherosclerotic heart disease of native coronary artery without angina pectoris: Secondary | ICD-10-CM | POA: Diagnosis not present

## 2021-06-13 DIAGNOSIS — E782 Mixed hyperlipidemia: Secondary | ICD-10-CM | POA: Diagnosis not present

## 2021-06-13 NOTE — Progress Notes (Signed)
Patient referred by Crist Infante, MD for elevated calcium score  Subjective:   Colin Garrett, male    DOB: April 02, 1946, 75 y.o.   MRN: [id number]   No chief complaint on file.   HPI  75 y.o. Caucasian male with hypertension, hyperlipidemia, CAD   In 12/2020, patient reported episodes of burning pain in right side of chest, seem to occurs at rest.  Usually does not occur with exertion.  Symptoms have improved with metoprolol as well as pantoprazole. There are no plans for EGD at this time, as per Dr. Carlean Purl.   In the meantime, his episodes of chest pain have completely resolved.  Blood pressure elevated today, but usually lower at home, albeit runs average >130/80 mmHg.    Patient reports walking regularly, but has had decreased energy levels.  He is suffering from depression and was recently started on sertraline.  Patient  Current Outpatient Medications on File Prior to Visit  Medication Sig Dispense Refill   aspirin 81 MG tablet Take 81 mg by mouth daily.     atorvastatin (LIPITOR) 40 MG tablet Take 40 mg by mouth daily.     Cholecalciferol (D3-1000) 25 MCG (1000 UT) capsule Take 1,000 Units by mouth daily.     ezetimibe (ZETIA) 10 MG tablet Take 10 mg by mouth daily.     metoprolol succinate (TOPROL-XL) 50 MG 24 hr tablet Take 1 tablet (50 mg total) by mouth daily. 90 tablet 3   nitroGLYCERIN (NITROSTAT) 0.4 MG SL tablet Place 1 tablet (0.4 mg total) under the tongue every 5 (five) minutes as needed for chest pain. 30 tablet 3   PANTOPRAZOLE SODIUM PO Take 40 mg by mouth daily.      ramipril (ALTACE) 5 MG capsule Take 5 mg by mouth daily.     sertraline (ZOLOFT) 50 MG tablet Take 50 mg by mouth daily.     triamcinolone (NASACORT) 55 MCG/ACT AERO nasal inhaler Place 1 spray into the nose daily.     vitamin B-12 (CYANOCOBALAMIN) 500 MCG tablet Take 500 mcg by mouth daily.     No current facility-administered medications on file prior to visit.    Cardiovascular and other  pertinent studies:  EKG 06/13/2021: Sinus rhythm 66 bpm  Normal EKG  EKG 12/28/2020: Sinus rhythm 74 bpm IVCD  Exercise tetrofosmin stress test  05/01/2020: Abnormal ECG stress.  Exercise was terminated due to fatigue/weakness.  Resting EKG demonstrated normal sinus rhythm. Observed intraventricular conduction delay. Peak EKG revealed 2 mm down-sloping ST depression present. During exercise the peak ECG revealed no arrhythmias. The calculated Duke Treadmill Score is -2.52. The patient exercised for 7 minutes and 29 seconds of a Bruce protocol, achieving approximately 9.34 METs.  Normal BP response. Myocardial perfusion is normal. Overall LV systolic function is normal without regional wall motion abnormalities. Stress LV EF: 63%.  No previous exam available for comparison. Intermediate risk study due to abnormal EKG response.   EKG 04/12/2020:  Sinus rhythm 71 bpm Nonspecific QRS widening  CT cardiac scoring 02/17/2020: Total Agatston Score: 254 MESA database percentile: 57 Left Main: 0 LAD: 188 LCx: 60.2 RCA: 5.8   Total Agatston Score: 254 MESA database percentile: 57  Recent labs: 01/25/2020: Glucose 99. BUN/Cr 18/0.8. EGFR 95. Na/K 138/4.7. Rest of the CMP normal HH 17/57. MCV 99, Platelets 229 HbA1C N/A Chol 138, TG 67, HDL 65, LDL 60 TSH 1.2 normal    ROS       Vitals:   06/13/21 1047 06/13/21 1057  BP: (!) 150/73 (!) 147/75  Pulse: 69 69  Temp: 98 F (36.7 C)   SpO2: 97% 97%     Body mass index is 25.7 kg/m. Filed Weights   06/13/21 1047  Weight: 156 lb 12.8 oz (71.1 kg)      Objective:   Physical Exam Vitals and nursing note reviewed.  Constitutional:      General: He is not in acute distress. Neck:     Vascular: No JVD.  Cardiovascular:     Rate and Rhythm: Normal rate and regular rhythm.     Heart sounds: Normal heart sounds. No murmur heard. Pulmonary:     Effort: Pulmonary effort is normal.     Breath sounds: Normal breath sounds. No  wheezing or rales.  Musculoskeletal:     Right lower leg: No edema.     Left lower leg: No edema.         Assessment & Recommendations:   75 y.o. Caucasian male with hypertension, hyperlipidemia, CAD  CAD: Atypical chest pain symptoms less likely to be angina.  No ischemia on stress test (04/2020). Given improvement in symptoms, continue current medical therapy.  Continue Aspirin 81 mg, lipitor 40 mg, Zetia 10 mg, metoprolol, ramipril, SL NTG prn  Hypertension: Uncontrolled.  Patient would like to make diet and lifestyle changes, mainly reducing salt intake and increasing physical activity.  Blood pressure remains elevated, could consider increasing ramipril to 10 mg daily.  Patient would like to hold off for now.  Mixed hyperlipidemia: Excellent control on Lipitor 40 mg, Zetia 10 mg daily.  F/u in 10/2021.    Nigel Mormon, MD [facility] Pager: [phone] Office: [phone]

## 2021-07-10 DIAGNOSIS — Z23 Encounter for immunization: Secondary | ICD-10-CM | POA: Diagnosis not present

## 2021-07-16 NOTE — Telephone Encounter (Signed)
From pt

## 2021-09-26 DIAGNOSIS — R5383 Other fatigue: Secondary | ICD-10-CM | POA: Diagnosis not present

## 2021-09-26 DIAGNOSIS — R0981 Nasal congestion: Secondary | ICD-10-CM | POA: Diagnosis not present

## 2021-09-26 DIAGNOSIS — Z1152 Encounter for screening for COVID-19: Secondary | ICD-10-CM | POA: Diagnosis not present

## 2021-09-26 DIAGNOSIS — J029 Acute pharyngitis, unspecified: Secondary | ICD-10-CM | POA: Diagnosis not present

## 2021-09-26 DIAGNOSIS — R051 Acute cough: Secondary | ICD-10-CM | POA: Diagnosis not present

## 2021-09-26 DIAGNOSIS — I10 Essential (primary) hypertension: Secondary | ICD-10-CM | POA: Diagnosis not present

## 2021-09-26 DIAGNOSIS — J069 Acute upper respiratory infection, unspecified: Secondary | ICD-10-CM | POA: Diagnosis not present

## 2021-10-09 DIAGNOSIS — L03113 Cellulitis of right upper limb: Secondary | ICD-10-CM | POA: Diagnosis not present

## 2021-10-09 DIAGNOSIS — J029 Acute pharyngitis, unspecified: Secondary | ICD-10-CM | POA: Diagnosis not present

## 2021-10-09 DIAGNOSIS — M25531 Pain in right wrist: Secondary | ICD-10-CM | POA: Diagnosis not present

## 2021-10-15 ENCOUNTER — Ambulatory Visit: Payer: Medicare Other | Admitting: Cardiology

## 2021-11-09 DIAGNOSIS — R972 Elevated prostate specific antigen [PSA]: Secondary | ICD-10-CM | POA: Diagnosis not present

## 2021-11-27 ENCOUNTER — Other Ambulatory Visit: Payer: Self-pay | Admitting: Cardiology

## 2021-12-19 ENCOUNTER — Encounter: Payer: Self-pay | Admitting: Cardiology

## 2021-12-19 ENCOUNTER — Other Ambulatory Visit: Payer: Self-pay

## 2021-12-19 ENCOUNTER — Ambulatory Visit: Payer: Medicare Other | Admitting: Cardiology

## 2021-12-19 VITALS — BP 154/70 | HR 65 | Temp 97.8°F | Resp 16 | Ht 65.0 in | Wt 151.0 lb

## 2021-12-19 DIAGNOSIS — I10 Essential (primary) hypertension: Secondary | ICD-10-CM

## 2021-12-19 DIAGNOSIS — I2510 Atherosclerotic heart disease of native coronary artery without angina pectoris: Secondary | ICD-10-CM

## 2021-12-19 DIAGNOSIS — E782 Mixed hyperlipidemia: Secondary | ICD-10-CM | POA: Diagnosis not present

## 2021-12-19 NOTE — Progress Notes (Signed)
? ? ?Patient referred by Crist Infante, MD for elevated calcium score ? ?Subjective:  ? ?Colin Garrett, male    DOB: Jul 11, 1946, 76 y.o.   MRN: [id number] ? ? ?Chief Complaint  ?Patient presents with  ? Hypertension  ? Coronary Artery Disease  ? Follow-up  ? ? ?HPI ? ?76 y.o. Caucasian male with hypertension, hyperlipidemia, CAD ? ? ?Patient is doing well. He walks 1-2 miles without any symptoms of chest pain. Blood pressure is well controlled at home, but elevated in office visits. He is tolerating Aspirin without any bleeding. ? ? ?Current Outpatient Medications:  ?  aspirin 81 MG tablet, Take 81 mg by mouth daily., Disp: , Rfl:  ?  atorvastatin (LIPITOR) 40 MG tablet, Take 40 mg by mouth daily., Disp: , Rfl:  ?  Cholecalciferol (D3-1000) 25 MCG (1000 UT) capsule, Take 1,000 Units by mouth daily., Disp: , Rfl:  ?  ezetimibe (ZETIA) 10 MG tablet, Take 10 mg by mouth daily., Disp: , Rfl:  ?  metoprolol succinate (TOPROL-XL) 50 MG 24 hr tablet, Take 1 tablet (50 mg total) by mouth daily., Disp: 90 tablet, Rfl: 3 ?  nitroGLYCERIN (NITROSTAT) 0.4 MG SL tablet, Place 1 tablet (0.4 mg total) under the tongue every 5 (five) minutes as needed for chest pain., Disp: 30 tablet, Rfl: 3 ?  PANTOPRAZOLE SODIUM PO, Take 40 mg by mouth daily. , Disp: , Rfl:  ?  ramipril (ALTACE) 5 MG capsule, TAKE ONE CAPSULE BY MOUTH DAILY, Disp: 90 capsule, Rfl: 1 ?  sertraline (ZOLOFT) 50 MG tablet, Take 50 mg by mouth daily., Disp: , Rfl:  ?  triamcinolone (NASACORT) 55 MCG/ACT AERO nasal inhaler, Place 1 spray into the nose daily., Disp: , Rfl:  ?  vitamin B-12 (CYANOCOBALAMIN) 500 MCG tablet, Take 500 mcg by mouth daily., Disp: , Rfl:  ? ? ? ?Cardiovascular and other pertinent studies: ? ?EKG 12/19/2021: ?Sinus rhythm 68 bpm ?Anteroseptal infarct -age undetermined ? ?Exercise tetrofosmin stress test  05/01/2020: ?Abnormal ECG stress.  Exercise was terminated due to fatigue/weakness.  ?Resting EKG demonstrated normal sinus rhythm. Observed  intraventricular conduction delay. ?Peak EKG revealed 2 mm down-sloping ST depression present. During exercise the peak ECG revealed no arrhythmias. The calculated Duke Treadmill Score is -2.52. ?The patient exercised for 7 minutes and 29 seconds of a Bruce protocol, achieving approximately 9.34 METs.  Normal BP response. ?Myocardial perfusion is normal. ?Overall LV systolic function is normal without regional wall motion abnormalities. Stress LV EF: 63%.  ?No previous exam available for comparison. Intermediate risk study due to abnormal EKG response.  ? ?CT cardiac scoring 02/17/2020: ?Total Agatston Score: 254 ?MESA database percentile: 41 ?Left Main: 0 ?LAD: 188 ?LCx: 60.2 ?RCA: 5.8 ?  ?Total Agatston Score: 254 ?MESA database percentile: 93 ? ?Recent labs: ?02/2021: ?Glucose NA, BUN/Cr 13/0.8. EGFR 94. K 4.5. Rest of the CMP normal ?HbA1C 5.4% ?Chol 141, TG 90, HDL 58, LDL 65 ?TSH 0.5 normal ? ?01/25/2020: ?Glucose 99. BUN/Cr 18/0.8. EGFR 95. Na/K 138/4.7. Rest of the CMP normal ?[place] 17/57. MCV 99, Platelets 229 ?HbA1C N/A ?Chol 138, TG 67, HDL 65, LDL 60 ?TSH 1.2 normal ? ? ? ?Review of Systems  ?Cardiovascular:  Negative for chest pain, dyspnea on exertion, leg swelling, palpitations and syncope.  ? ?   ? ? ?Vitals:  ? 12/19/21 1304  ?BP: (!) 155/78  ?Pulse: 66  ?Resp: 16  ?Temp: 97.8 ?F (36.6 ?C)  ?SpO2: 96%  ? ? ? ?Body mass index is  25.13 kg/m?. Danley Danker Weights  ? 12/19/21 1304  ?Weight: 151 lb (68.5 kg)  ? ? ? ? ?Objective:  ? Physical Exam ?Vitals and nursing note reviewed.  ?Constitutional:   ?   General: He is not in acute distress. ?Neck:  ?   Vascular: No JVD.  ?Cardiovascular:  ?   Rate and Rhythm: Normal rate and regular rhythm.  ?   Heart sounds: Normal heart sounds. No murmur heard. ?Pulmonary:  ?   Effort: Pulmonary effort is normal.  ?   Breath sounds: Normal breath sounds. No wheezing or rales.  ?Musculoskeletal:  ?   Right lower leg: No edema.  ?   Left lower leg: No edema.  ? ? ?  ICD-10-CM   ?1.  Coronary artery disease involving native coronary artery of native heart without angina pectoris  I25.10 EKG 12-Lead  ?  ?2. Essential hypertension  I10   ?  ?3. Mixed hyperlipidemia  E78.2   ?  ? ? ?   ? ?Assessment & Recommendations:  ? ?76 y.o. Caucasian male with hypertension, hyperlipidemia, CAD ? ?CAD: ?Elevated coronary calcium score, but atypical chest pain symptoms less likely to be angina.  No ischemia on stress test (04/2020). ?Given improvement in symptoms, continue current medical therapy.  ?Continue Aspirin 81 mg, lipitor 40 mg, Zetia 10 mg, metoprolol, ramipril, SL NTG prn ? ?Hypertension: ?Suspected component of white coat hypertension. No changes made today. ? ?Mixed hyperlipidemia: ?Excellent control on Lipitor 40 mg, Zetia 10 mg daily. ? ?F/u in 10/2022.   ? ?Nigel Mormon, MD ?[facility] ?Pager: [phone] ?Office: [phone] ?

## 2021-12-19 NOTE — Patient Instructions (Signed)
Diet & Lifestyle recommendations: ? ?Physical activity recommendation (The Physical Activity Guidelines for Americans. JAMA 2018;Nov 12) ?At least 150-300 minutes a week of moderate-intensity, or 75-150 minutes a week of vigorous-intensity aerobic physical activity, or an equivalent combination of moderate- and vigorous-intensity aerobic activity. Adults should perform muscle-strengthening activities on 2 or more days a week. Older adults should do multicomponent physical activity that includes balance training as well as aerobic and muscle-strengthening activities. Benefits of increased physical activity include lower risk of mortality including cardiovascular mortality, lower risk of cardiovascular events and associated risk factors (hypertension and diabetes), and lower risk of many cancers (including bladder, breast, colon, endometrium, esophagus, kidney, lung, and stomach). Additional improvments have been seen in cognition, risk of dementia, anxiety and depression, improved bone health, lower risk of falls, and associated injuries. ? ?Dietary recommendation ?The 2019 ACC/AHA guidelines promote nutrition as a main fixture of cardiovascular wellness, with a recommendation for a varied diet of fruit, vegetables, fish, legumes, and whole grains (Class I), as well as recommendations to reduce sodium, cholesterol, processed meats, and refined sugars (Class IIa recommendation).10 Sodium intake, a topic of some controversy as of late, is recommended to be kept at 1,500 mg/day or less, far below the average daily intake in the [place] of 3,409 mg/day, and notably below that of previous [place] recommendations for '300mg'$ /day.10,11 For those unable to reach 1,500 mg/day, they recommend at least a reduction of 1000 mg/day. ? ?A Pesco-Mediterranean Diet With Intermittent Fasting: JACC Review Topic of the Week. [place] [phone] ?Pesco-Mediterranean diet, it is supplemented with extra-virgin olive oil (EVOO),  which is the principle fat source, along with moderate amounts of dairy (particularly yogurt and cheese) and eggs, as well as modest amounts of alcohol consumption (ideally red wine with the evening meal), but few red and processed meats. ? ?

## 2021-12-20 ENCOUNTER — Other Ambulatory Visit: Payer: Self-pay | Admitting: Cardiology

## 2021-12-28 DIAGNOSIS — H5203 Hypermetropia, bilateral: Secondary | ICD-10-CM | POA: Diagnosis not present

## 2021-12-28 DIAGNOSIS — H25013 Cortical age-related cataract, bilateral: Secondary | ICD-10-CM | POA: Diagnosis not present

## 2022-02-27 ENCOUNTER — Encounter: Payer: Self-pay | Admitting: Cardiology

## 2022-02-27 NOTE — Telephone Encounter (Signed)
From patient.

## 2022-03-08 DIAGNOSIS — E785 Hyperlipidemia, unspecified: Secondary | ICD-10-CM | POA: Diagnosis not present

## 2022-03-08 DIAGNOSIS — Z0000 Encounter for general adult medical examination without abnormal findings: Secondary | ICD-10-CM | POA: Diagnosis not present

## 2022-03-08 DIAGNOSIS — I10 Essential (primary) hypertension: Secondary | ICD-10-CM | POA: Diagnosis not present

## 2022-03-08 DIAGNOSIS — R7989 Other specified abnormal findings of blood chemistry: Secondary | ICD-10-CM | POA: Diagnosis not present

## 2022-03-08 DIAGNOSIS — Z125 Encounter for screening for malignant neoplasm of prostate: Secondary | ICD-10-CM | POA: Diagnosis not present

## 2022-03-15 DIAGNOSIS — N401 Enlarged prostate with lower urinary tract symptoms: Secondary | ICD-10-CM | POA: Diagnosis not present

## 2022-03-15 DIAGNOSIS — I2510 Atherosclerotic heart disease of native coronary artery without angina pectoris: Secondary | ICD-10-CM | POA: Diagnosis not present

## 2022-03-15 DIAGNOSIS — K219 Gastro-esophageal reflux disease without esophagitis: Secondary | ICD-10-CM | POA: Diagnosis not present

## 2022-03-15 DIAGNOSIS — Z0000 Encounter for general adult medical examination without abnormal findings: Secondary | ICD-10-CM | POA: Diagnosis not present

## 2022-03-15 DIAGNOSIS — Z1339 Encounter for screening examination for other mental health and behavioral disorders: Secondary | ICD-10-CM | POA: Diagnosis not present

## 2022-03-15 DIAGNOSIS — R82998 Other abnormal findings in urine: Secondary | ICD-10-CM | POA: Diagnosis not present

## 2022-03-15 DIAGNOSIS — Z1331 Encounter for screening for depression: Secondary | ICD-10-CM | POA: Diagnosis not present

## 2022-03-15 DIAGNOSIS — I10 Essential (primary) hypertension: Secondary | ICD-10-CM | POA: Diagnosis not present

## 2022-03-15 DIAGNOSIS — M810 Age-related osteoporosis without current pathological fracture: Secondary | ICD-10-CM | POA: Diagnosis not present

## 2022-03-18 NOTE — Telephone Encounter (Signed)
From patient.

## 2022-03-25 DIAGNOSIS — Z0000 Encounter for general adult medical examination without abnormal findings: Secondary | ICD-10-CM | POA: Diagnosis not present

## 2022-04-10 DIAGNOSIS — R319 Hematuria, unspecified: Secondary | ICD-10-CM | POA: Diagnosis not present

## 2022-04-10 DIAGNOSIS — N390 Urinary tract infection, site not specified: Secondary | ICD-10-CM | POA: Diagnosis not present

## 2022-06-07 DIAGNOSIS — R972 Elevated prostate specific antigen [PSA]: Secondary | ICD-10-CM | POA: Diagnosis not present

## 2022-06-07 DIAGNOSIS — Z23 Encounter for immunization: Secondary | ICD-10-CM | POA: Diagnosis not present

## 2022-06-14 DIAGNOSIS — R972 Elevated prostate specific antigen [PSA]: Secondary | ICD-10-CM | POA: Diagnosis not present

## 2022-06-14 DIAGNOSIS — R3912 Poor urinary stream: Secondary | ICD-10-CM | POA: Diagnosis not present

## 2022-06-14 DIAGNOSIS — N401 Enlarged prostate with lower urinary tract symptoms: Secondary | ICD-10-CM | POA: Diagnosis not present

## 2022-06-15 ENCOUNTER — Other Ambulatory Visit: Payer: Self-pay | Admitting: Cardiology

## 2022-06-28 DIAGNOSIS — Z23 Encounter for immunization: Secondary | ICD-10-CM | POA: Diagnosis not present

## 2022-08-28 DIAGNOSIS — J069 Acute upper respiratory infection, unspecified: Secondary | ICD-10-CM | POA: Diagnosis not present

## 2022-08-28 DIAGNOSIS — R058 Other specified cough: Secondary | ICD-10-CM | POA: Diagnosis not present

## 2022-08-28 DIAGNOSIS — R6883 Chills (without fever): Secondary | ICD-10-CM | POA: Diagnosis not present

## 2022-08-28 DIAGNOSIS — Z1152 Encounter for screening for COVID-19: Secondary | ICD-10-CM | POA: Diagnosis not present

## 2022-12-11 DIAGNOSIS — R972 Elevated prostate specific antigen [PSA]: Secondary | ICD-10-CM | POA: Diagnosis not present

## 2022-12-23 ENCOUNTER — Ambulatory Visit: Payer: Medicare Other | Admitting: Cardiology

## 2023-01-01 ENCOUNTER — Encounter: Payer: Self-pay | Admitting: Cardiology

## 2023-01-01 ENCOUNTER — Ambulatory Visit: Payer: Medicare Other | Admitting: Cardiology

## 2023-01-01 VITALS — BP 132/65 | HR 77 | Resp 16 | Ht 65.0 in | Wt 149.0 lb

## 2023-01-01 DIAGNOSIS — E782 Mixed hyperlipidemia: Secondary | ICD-10-CM

## 2023-01-01 DIAGNOSIS — I10 Essential (primary) hypertension: Secondary | ICD-10-CM | POA: Diagnosis not present

## 2023-01-01 DIAGNOSIS — I2510 Atherosclerotic heart disease of native coronary artery without angina pectoris: Secondary | ICD-10-CM | POA: Diagnosis not present

## 2023-01-01 MED ORDER — ATORVASTATIN CALCIUM 40 MG PO TABS: 40.0000 mg | ORAL_TABLET | Freq: Every day | ORAL | 3 refills | Status: DC

## 2023-01-01 MED ORDER — EZETIMIBE 10 MG PO TABS: 10.0000 mg | ORAL_TABLET | Freq: Every day | ORAL | 3 refills | Status: DC

## 2023-01-01 MED ORDER — RAMIPRIL 5 MG PO CAPS: 5.0000 mg | ORAL_CAPSULE | Freq: Every day | ORAL | 3 refills | Status: DC

## 2023-01-01 MED ORDER — ASPIRIN 81 MG PO TBEC: 81.0000 mg | DELAYED_RELEASE_TABLET | Freq: Every day | ORAL | 3 refills | Status: DC

## 2023-01-01 NOTE — Progress Notes (Signed)
Patient referred by Crist Infante, MD for elevated calcium score  Subjective:   Colin Garrett, male    DOB: May 31, 1946, 77 y.o.   MRN: [id number]   Chief Complaint  Patient presents with   Coronary Artery Disease   Follow-up    1 year    HPI  77 y.o. Caucasian male with hypertension, hyperlipidemia, CAD  Patient is doing well.  He is switched from keto diet to Mediterranean diet.  He is exercising 3 times a week without any symptoms of chest pain, shortness of breath etc.    Current Outpatient Medications:    aspirin 81 MG tablet, Take 81 mg by mouth daily., Disp: , Rfl:    atorvastatin (LIPITOR) 40 MG tablet, Take 40 mg by mouth daily., Disp: , Rfl:    Cholecalciferol (D3-1000) 25 MCG (1000 UT) capsule, Take 1,000 Units by mouth daily., Disp: , Rfl:    ezetimibe (ZETIA) 10 MG tablet, Take 10 mg by mouth daily., Disp: , Rfl:    metoprolol succinate (TOPROL-XL) 50 MG 24 hr tablet, TAKE 1 TABLET BY MOUTH DAILY, Disp: 90 tablet, Rfl: 3   nitroGLYCERIN (NITROSTAT) 0.4 MG SL tablet, Place 1 tablet (0.4 mg total) under the tongue every 5 (five) minutes as needed for chest pain., Disp: 30 tablet, Rfl: 3   PANTOPRAZOLE SODIUM PO, Take 40 mg by mouth daily. , Disp: , Rfl:    ramipril (ALTACE) 5 MG capsule, TAKE ONE CAPSULE BY MOUTH DAILY, Disp: 90 capsule, Rfl: 2   sertraline (ZOLOFT) 50 MG tablet, Take 50 mg by mouth daily., Disp: , Rfl:    triamcinolone (NASACORT) 55 MCG/ACT AERO nasal inhaler, Place 1 spray into the nose daily., Disp: , Rfl:    vitamin B-12 (CYANOCOBALAMIN) 500 MCG tablet, Take 500 mcg by mouth daily., Disp: , Rfl:     Cardiovascular and other pertinent studies:  EKG 01/01/2023: Sinus rhythm 77 bpm  Normal EKG  Exercise tetrofosmin stress test  05/01/2020: Abnormal ECG stress.  Exercise was terminated due to fatigue/weakness.  Resting EKG demonstrated normal sinus rhythm. Observed intraventricular conduction delay. Peak EKG revealed 2 mm down-sloping ST  depression present. During exercise the peak ECG revealed no arrhythmias. The calculated Duke Treadmill Score is -2.52. The patient exercised for 7 minutes and 29 seconds of a Bruce protocol, achieving approximately 9.34 METs.  Normal BP response. Myocardial perfusion is normal. Overall LV systolic function is normal without regional wall motion abnormalities. Stress LV EF: 63%.  No previous exam available for comparison. Intermediate risk study due to abnormal EKG response.   CT cardiac scoring 02/17/2020: Total Agatston Score: 254 MESA database percentile: 57 Left Main: 0 LAD: 188 LCx: 60.2 RCA: 5.8   Total Agatston Score: 254 MESA database percentile: 57  Recent labs: 03/08/2022: Glucose 96, BUN/Cr 14/0.7. EGFR 110. Na/K 139/4.3. Rest of the CMP normal H/H 14/42. MCV 95. Platelets 187 HbA1C NA Chol 125, TG 68, HDL 43, LDL 68 TSH NA   Review of Systems  Cardiovascular:  Negative for chest pain, dyspnea on exertion, leg swelling, palpitations and syncope.         Vitals:   01/01/23 1411  BP: 132/65  Pulse: 77  Resp: 16  SpO2: 98%     Body mass index is 24.79 kg/m. Filed Weights   01/01/23 1411  Weight: 149 lb (67.6 kg)      Objective:   Physical Exam Vitals and nursing note reviewed.  Constitutional:      General: He is  not in acute distress. Neck:     Vascular: No JVD.  Cardiovascular:     Rate and Rhythm: Normal rate and regular rhythm.     Heart sounds: Normal heart sounds. No murmur heard. Pulmonary:     Effort: Pulmonary effort is normal.     Breath sounds: Normal breath sounds. No wheezing or rales.  Musculoskeletal:     Right lower leg: No edema.     Left lower leg: No edema.       ICD-10-CM   1. Coronary artery disease involving native coronary artery of native heart without angina pectoris  I25.10 EKG 12-Lead    2. Essential hypertension  I10     3. Mixed hyperlipidemia  E78.2           Assessment & Recommendations:   77 y.o.  Caucasian male with hypertension, hyperlipidemia, CAD  CAD: Elevated coronary calcium score, but atypical chest pain symptoms less likely to be angina.  No ischemia on stress test (04/2020). Given improvement in symptoms, continue current medical therapy.  Continue Aspirin 81 mg, lipitor 40 mg, Zetia 10 mg, metoprolol, ramipril, SL NTG prn Upcoming labs with Dr. Blanch Media office soon.  Hypertension: Suspected component of white coat hypertension. No changes made today.  Mixed hyperlipidemia: Excellent control on Lipitor 40 mg, Zetia 10 mg daily.  F/u in 1 year  Nigel Mormon, MD [facility] Pager: [phone] Office: [phone]

## 2023-01-06 DIAGNOSIS — E860 Dehydration: Secondary | ICD-10-CM | POA: Diagnosis not present

## 2023-01-06 DIAGNOSIS — M810 Age-related osteoporosis without current pathological fracture: Secondary | ICD-10-CM | POA: Diagnosis not present

## 2023-01-06 DIAGNOSIS — R6882 Decreased libido: Secondary | ICD-10-CM | POA: Diagnosis not present

## 2023-01-06 DIAGNOSIS — R5383 Other fatigue: Secondary | ICD-10-CM | POA: Diagnosis not present

## 2023-01-06 DIAGNOSIS — I10 Essential (primary) hypertension: Secondary | ICD-10-CM | POA: Diagnosis not present

## 2023-01-06 DIAGNOSIS — E559 Vitamin D deficiency, unspecified: Secondary | ICD-10-CM | POA: Diagnosis not present

## 2023-01-07 DIAGNOSIS — H2513 Age-related nuclear cataract, bilateral: Secondary | ICD-10-CM | POA: Diagnosis not present

## 2023-02-11 ENCOUNTER — Other Ambulatory Visit: Payer: Self-pay | Admitting: Cardiology

## 2023-02-11 DIAGNOSIS — I2510 Atherosclerotic heart disease of native coronary artery without angina pectoris: Secondary | ICD-10-CM

## 2023-02-11 MED ORDER — NITROGLYCERIN 0.4 MG SL SUBL: 0.4000 mg | SUBLINGUAL_TABLET | SUBLINGUAL | 3 refills | Status: DC | PRN

## 2023-04-21 ENCOUNTER — Other Ambulatory Visit: Payer: Self-pay | Admitting: Cardiovascular Disease

## 2023-04-21 DIAGNOSIS — Z0000 Encounter for general adult medical examination without abnormal findings: Secondary | ICD-10-CM

## 2023-05-07 ENCOUNTER — Ambulatory Visit: Payer: PRIVATE HEALTH INSURANCE

## 2023-05-07 DIAGNOSIS — Z0000 Encounter for general adult medical examination without abnormal findings: Secondary | ICD-10-CM | POA: Diagnosis not present

## 2023-05-12 DIAGNOSIS — E785 Hyperlipidemia, unspecified: Secondary | ICD-10-CM | POA: Diagnosis not present

## 2023-05-12 DIAGNOSIS — R5383 Other fatigue: Secondary | ICD-10-CM | POA: Diagnosis not present

## 2023-05-12 DIAGNOSIS — M810 Age-related osteoporosis without current pathological fracture: Secondary | ICD-10-CM | POA: Diagnosis not present

## 2023-05-12 DIAGNOSIS — E559 Vitamin D deficiency, unspecified: Secondary | ICD-10-CM | POA: Diagnosis not present

## 2023-05-12 DIAGNOSIS — N401 Enlarged prostate with lower urinary tract symptoms: Secondary | ICD-10-CM | POA: Diagnosis not present

## 2023-05-12 DIAGNOSIS — I10 Essential (primary) hypertension: Secondary | ICD-10-CM | POA: Diagnosis not present

## 2023-05-12 DIAGNOSIS — I2510 Atherosclerotic heart disease of native coronary artery without angina pectoris: Secondary | ICD-10-CM | POA: Diagnosis not present

## 2023-05-19 DIAGNOSIS — E785 Hyperlipidemia, unspecified: Secondary | ICD-10-CM | POA: Diagnosis not present

## 2023-05-19 DIAGNOSIS — F419 Anxiety disorder, unspecified: Secondary | ICD-10-CM | POA: Diagnosis not present

## 2023-05-19 DIAGNOSIS — R400 Somnolence: Secondary | ICD-10-CM | POA: Diagnosis not present

## 2023-05-19 DIAGNOSIS — M25561 Pain in right knee: Secondary | ICD-10-CM | POA: Diagnosis not present

## 2023-05-19 DIAGNOSIS — I2510 Atherosclerotic heart disease of native coronary artery without angina pectoris: Secondary | ICD-10-CM | POA: Diagnosis not present

## 2023-05-19 DIAGNOSIS — Z0000 Encounter for general adult medical examination without abnormal findings: Secondary | ICD-10-CM | POA: Diagnosis not present

## 2023-05-19 DIAGNOSIS — D126 Benign neoplasm of colon, unspecified: Secondary | ICD-10-CM | POA: Diagnosis not present

## 2023-05-19 DIAGNOSIS — R82998 Other abnormal findings in urine: Secondary | ICD-10-CM | POA: Diagnosis not present

## 2023-05-19 DIAGNOSIS — R5383 Other fatigue: Secondary | ICD-10-CM | POA: Diagnosis not present

## 2023-05-19 DIAGNOSIS — G43109 Migraine with aura, not intractable, without status migrainosus: Secondary | ICD-10-CM | POA: Diagnosis not present

## 2023-05-19 DIAGNOSIS — N401 Enlarged prostate with lower urinary tract symptoms: Secondary | ICD-10-CM | POA: Diagnosis not present

## 2023-05-19 DIAGNOSIS — M810 Age-related osteoporosis without current pathological fracture: Secondary | ICD-10-CM | POA: Diagnosis not present

## 2023-05-19 DIAGNOSIS — I10 Essential (primary) hypertension: Secondary | ICD-10-CM | POA: Diagnosis not present

## 2023-05-19 DIAGNOSIS — L409 Psoriasis, unspecified: Secondary | ICD-10-CM | POA: Diagnosis not present

## 2023-06-06 DIAGNOSIS — F419 Anxiety disorder, unspecified: Secondary | ICD-10-CM | POA: Diagnosis not present

## 2023-06-06 DIAGNOSIS — R3589 Other polyuria: Secondary | ICD-10-CM | POA: Diagnosis not present

## 2023-06-10 DIAGNOSIS — R0683 Snoring: Secondary | ICD-10-CM | POA: Diagnosis not present

## 2023-06-16 DIAGNOSIS — R972 Elevated prostate specific antigen [PSA]: Secondary | ICD-10-CM | POA: Diagnosis not present

## 2023-06-24 DIAGNOSIS — N401 Enlarged prostate with lower urinary tract symptoms: Secondary | ICD-10-CM | POA: Diagnosis not present

## 2023-06-24 DIAGNOSIS — R972 Elevated prostate specific antigen [PSA]: Secondary | ICD-10-CM | POA: Diagnosis not present

## 2023-06-24 DIAGNOSIS — R3912 Poor urinary stream: Secondary | ICD-10-CM | POA: Diagnosis not present

## 2023-06-27 ENCOUNTER — Other Ambulatory Visit: Payer: Self-pay | Admitting: Urology

## 2023-06-27 DIAGNOSIS — R972 Elevated prostate specific antigen [PSA]: Secondary | ICD-10-CM

## 2023-07-02 DIAGNOSIS — G4733 Obstructive sleep apnea (adult) (pediatric): Secondary | ICD-10-CM | POA: Diagnosis not present

## 2023-07-09 DIAGNOSIS — Z23 Encounter for immunization: Secondary | ICD-10-CM | POA: Diagnosis not present

## 2023-08-09 DIAGNOSIS — Z1212 Encounter for screening for malignant neoplasm of rectum: Secondary | ICD-10-CM | POA: Diagnosis not present

## 2023-08-22 ENCOUNTER — Ambulatory Visit
Admission: RE | Admit: 2023-08-22 | Discharge: 2023-08-22 | Disposition: A | Discharge status: Home / Self Care | Payer: Medicare Other | Source: Ambulatory Visit | Attending: Urology | Admitting: Urology

## 2023-08-22 DIAGNOSIS — R972 Elevated prostate specific antigen [PSA]: Secondary | ICD-10-CM

## 2023-08-22 MED ORDER — GADOPICLENOL 0.5 MMOL/ML IV SOLN
7.0000 mL | Freq: Once | INTRAVENOUS | Status: AC | PRN
  Administered 2023-08-22: 7 mL via INTRAVENOUS

## 2023-09-01 ENCOUNTER — Other Ambulatory Visit: Payer: Self-pay | Admitting: Urology

## 2023-09-01 DIAGNOSIS — C414 Malignant neoplasm of pelvic bones, sacrum and coccyx: Secondary | ICD-10-CM

## 2023-09-02 ENCOUNTER — Ambulatory Visit
Admission: RE | Admit: 2023-09-02 | Discharge: 2023-09-02 | Disposition: A | Discharge status: Home / Self Care | Payer: Medicare Other | Source: Ambulatory Visit | Attending: Urology | Admitting: Urology

## 2023-09-02 DIAGNOSIS — C414 Malignant neoplasm of pelvic bones, sacrum and coccyx: Secondary | ICD-10-CM

## 2023-09-02 DIAGNOSIS — M899 Disorder of bone, unspecified: Secondary | ICD-10-CM | POA: Diagnosis not present

## 2023-09-02 MED ORDER — GADOPICLENOL 0.5 MMOL/ML IV SOLN
7.0000 mL | Freq: Once | INTRAVENOUS | Status: AC | PRN
  Administered 2023-09-02: 7 mL via INTRAVENOUS

## 2023-09-22 ENCOUNTER — Other Ambulatory Visit (HOSPITAL_COMMUNITY): Payer: Self-pay | Admitting: Urology

## 2023-09-22 DIAGNOSIS — R972 Elevated prostate specific antigen [PSA]: Secondary | ICD-10-CM | POA: Diagnosis not present

## 2023-09-22 DIAGNOSIS — C414 Malignant neoplasm of pelvic bones, sacrum and coccyx: Secondary | ICD-10-CM

## 2023-09-24 DIAGNOSIS — G4733 Obstructive sleep apnea (adult) (pediatric): Secondary | ICD-10-CM | POA: Diagnosis not present

## 2023-09-24 NOTE — Progress Notes (Signed)
Richarda Overlie, MD  Claudean Kinds PROCEDURE / BIOPSY REVIEW Date: 09/24/23  Requested Biopsy site: Sacrum/coccyx Reason for request: Needs diagnosis Imaging review: Best seen on MRI pelvis  Decision: Approved Imaging modality to perform: CT Schedule with: Moderate Sedation Schedule for: Any VIR  Additional comments: CT guided biopsy of sacral / coccyx mass  Please contact me with questions, concerns, or if issue pertaining to this request arise.  Arn Medal, MD [facility]       Previous Messages    ----- Message ----- From: Claudean Kinds Sent: 09/22/2023   9:49 AM EST To: Claudean Kinds; Ir Procedure Requests Subject: CT Biopsy                                      Procedure : CT Biopsy  Reason : perutaneous biopsy of 3 cm sacral lesion seen on MRI Dx: Malignant neoplasm of pelvic bones, sacrum, and coccyx (HCC) [C41.4 (ICD-10-CM)]    History: MRI Pelvis w/ wo and MRI Prostate w/ wo  Provider: Heloise Purpura, MD  Provider contact: [phone]

## 2023-10-03 ENCOUNTER — Other Ambulatory Visit: Payer: Self-pay | Admitting: Radiology

## 2023-10-03 DIAGNOSIS — M533 Sacrococcygeal disorders, not elsewhere classified: Secondary | ICD-10-CM

## 2023-10-03 NOTE — H&P (Signed)
Chief Complaint: Patient was seen in consultation today for sacral lesion.  Referring Physician(s): Borden,Lester  Supervising Physician: Irish Lack  Patient Status: [facility] - Out-pt  History of Present Illness: Colin Garrett is a 77 y.o. male with a past medical history significant for depression, GERD, BPH, HTN, HLD and new sacral lesion who presents today for sacral lesion biopsy. Colin Garrett has been followed by urology for elevated PSA for several years and he was recently noted to have further elevation of his PSA. He underwent prostate MRI 08/26/23 due to higher PSA which noted a new sacral lesion. He then underwent MRI of the pelvis w/ and w/o contrast 09/02/23 which noted:   Nonspecific 3.5 cm mildly expansile lesion in the inferior sacrum. Benign and malignant etiologies are possible. Given the patient's age, metastatic disease and plasmacytoma/multiple myeloma should be considered. Sacral chordoma and chondrosarcoma generally occur in younger patients. CT of the sacrum would be useful to evaluate the lesion matrix. Ultimately, tissue sampling may be Necessary  He has been referred to IR for sacral lesion biopsy to further direct care.  Past Medical History:  Diagnosis Date   Allergy    Depression    GERD (gastroesophageal reflux disease)    Hx of adenomatous colonic polyps 05/15/2015   Hyperlipidemia    Hyperplasia of prostate    Hypertension     Past Surgical History:  Procedure Laterality Date   COLONOSCOPY     Multiple   ESOPHAGOGASTRODUODENOSCOPY     Multiple   INGUINAL HERNIA REPAIR Left    UPPER GASTROINTESTINAL ENDOSCOPY      Allergies: Patient has no known allergies.  Medications: Prior to Admission medications   Medication Sig Start Date End Date Taking? Authorizing Provider  aspirin EC 81 MG tablet Take 1 tablet (81 mg total) by mouth daily. Swallow whole. 01/01/23   Patwardhan, Anabel Bene, MD  atorvastatin (LIPITOR) 40 MG tablet Take 1 tablet (40  mg total) by mouth daily. 01/01/23   Patwardhan, Anabel Bene, MD  Cholecalciferol (D3-1000) 25 MCG (1000 UT) capsule Take 1,000 Units by mouth daily.    [provider]  ezetimibe (ZETIA) 10 MG tablet Take 1 tablet (10 mg total) by mouth daily. 01/01/23   Patwardhan, Anabel Bene, MD  nitroGLYCERIN (NITROSTAT) 0.4 MG SL tablet Place 1 tablet (0.4 mg total) under the tongue every 5 (five) minutes as needed for chest pain. 02/11/23 05/12/23  Patwardhan, Anabel Bene, MD  PANTOPRAZOLE SODIUM PO Take 40 mg by mouth daily.     [provider]  ramipril (ALTACE) 5 MG capsule Take 1 capsule (5 mg total) by mouth daily. 01/01/23   Patwardhan, Anabel Bene, MD  sertraline (ZOLOFT) 50 MG tablet Take 50 mg by mouth daily. 05/23/21   [provider]  triamcinolone (NASACORT) 55 MCG/ACT AERO nasal inhaler Place 1 spray into the nose daily.    [provider]  vitamin B-12 (CYANOCOBALAMIN) 500 MCG tablet Take 500 mcg by mouth daily.    [provider]     Family History  Problem Relation Age of Onset   Prostate cancer Brother 64   Heart disease Mother        Died at age 68   Heart disease Father        Died at age 68   Colon cancer Neg Hx    Colon polyps Neg Hx    Esophageal cancer Neg Hx    Gallbladder disease Neg Hx    Kidney disease Neg Hx  Social History   Socioeconomic History   Marital status: Married    Spouse name: Not on file   Number of children: 4   Years of education: Not on file   Highest education level: Not on file  Occupational History   Occupation: management  Tobacco Use   Smoking status: Former    Current packs/day: 0.00    Average packs/day: 1 pack/day for 30.0 years (30.0 ttl pk-yrs)    Types: Cigars, Cigarettes    Start date: 10/07/1974    Quit date: 10/07/2004    Years since quitting: 19.0   Smokeless tobacco: Never  Vaping Use   Vaping status: Never Used  Substance and Sexual Activity   Alcohol use: Yes    Alcohol/week: 0.0 standard  drinks of alcohol    Comment: Pt drinks 1-2 drinks a day   Drug use: No   Sexual activity: Not on file  Other Topics Concern   Not on file  Social History Narrative   Married 2 sons 2 daughters   Associate Professor, Chartered loss adjuster, Printmaker   Education level: Masters in Nurse, mental health   Originally from [place] in [place] since 2013-4, prior to that split time in [place] and [employer]    1 caffeine drink daily      Social Drivers of Corporate investment banker Strain: Not on file  Food Insecurity: Not on file  Transportation Needs: Not on file  Physical Activity: Not on file  Stress: Not on file  Social Connections: Unknown (02/18/2022)   Received from [employer], [facility]   Social Network    Social Network: Not on file     Review of Systems: A 12 point ROS discussed and pertinent positives are indicated in the HPI above.  All other systems are negative.  Review of Systems  Constitutional:  Negative for chills and fever.  Respiratory:  Negative for cough and shortness of breath.   Cardiovascular:  Negative for chest pain.  Gastrointestinal:  Negative for abdominal pain, diarrhea, nausea and vomiting.  Musculoskeletal:  Negative for back pain.  Neurological:  Negative for dizziness and headaches.    Vital Signs: There were no vitals taken for this visit.  Physical Exam Vitals reviewed.  Constitutional:      General: He is not in acute distress. HENT:     Head: Normocephalic.     Mouth/Throat:     Mouth: Mucous membranes are moist.     Pharynx: Oropharynx is clear. No oropharyngeal exudate or posterior oropharyngeal erythema.  Cardiovascular:     Rate and Rhythm: Normal rate and regular rhythm.  Pulmonary:     Effort: Pulmonary effort is normal.     Breath sounds: Normal breath sounds.  Abdominal:     General: There is no distension.     Palpations: Abdomen is soft.     Tenderness: There is no abdominal  tenderness.  Skin:    General: Skin is warm and dry.  Neurological:     Mental Status: He is alert and oriented to person, place, and time.  Psychiatric:        Mood and Affect: Mood normal.        Behavior: Behavior normal.        Thought Content: Thought content normal.        Judgment: Judgment normal.      MD Evaluation Airway: WNL Heart: WNL Abdomen: WNL Chest/ Lungs: WNL ASA  Classification: 2 Mallampati/Airway Score: One  Imaging: No results found.  Labs:  CBC: No results for input(s): "WBC", "HGB", "HCT", "PLT" in the last 8760 hours.  COAGS: No results for input(s): "INR", "APTT" in the last 8760 hours.  BMP: No results for input(s): "NA", "K", "CL", "CO2", "GLUCOSE", "BUN", "CALCIUM", "CREATININE", "GFRNONAA", "GFRAA" in the last 8760 hours.  Invalid input(s): "CMP"  LIVER FUNCTION TESTS: No results for input(s): "BILITOT", "AST", "ALT", "ALKPHOS", "PROT", "ALBUMIN" in the last 8760 hours.  TUMOR MARKERS: No results for input(s): "AFPTM", "CEA", "CA199", "CHROMGRNA" in the last 8760 hours.  Assessment and Plan:  77 y/o M with history of BPH incidentally noted to have a new sacral lesion on recent MRI who presents today for a sacral lesion biopsy to further direct care.  Risks and benefits of sacral lesion biopsy was discussed with the patient and/or patient's family including, but not limited to bleeding, infection, damage to adjacent structures or low yield requiring additional tests.  All of the questions were answered and there is agreement to proceed.  Consent signed and in chart.  Thank you for this interesting consult.  I greatly enjoyed meeting Colin Garrett and look forward to participating in their care.  A copy of this report was sent to the requesting provider on this date.  Electronically Signed: Villa Herb, PA-C 10/03/2023, 10:15 AM   I spent a total of  30 Minutes   in face to face in clinical consultation, greater than 50%  of which was counseling/coordinating care for sacral lesion.

## 2023-10-06 ENCOUNTER — Ambulatory Visit (HOSPITAL_COMMUNITY)
Admission: RE | Admit: 2023-10-06 | Discharge: 2023-10-06 | Disposition: A | Discharge status: Home / Self Care | Payer: Medicare Other | Source: Ambulatory Visit | Attending: Urology | Admitting: Urology

## 2023-10-06 ENCOUNTER — Other Ambulatory Visit: Payer: Self-pay

## 2023-10-06 DIAGNOSIS — C414 Malignant neoplasm of pelvic bones, sacrum and coccyx: Secondary | ICD-10-CM | POA: Diagnosis not present

## 2023-10-06 DIAGNOSIS — K219 Gastro-esophageal reflux disease without esophagitis: Secondary | ICD-10-CM | POA: Insufficient documentation

## 2023-10-06 DIAGNOSIS — M533 Sacrococcygeal disorders, not elsewhere classified: Secondary | ICD-10-CM | POA: Insufficient documentation

## 2023-10-06 DIAGNOSIS — C9000 Multiple myeloma not having achieved remission: Secondary | ICD-10-CM | POA: Diagnosis not present

## 2023-10-06 DIAGNOSIS — F32A Depression, unspecified: Secondary | ICD-10-CM | POA: Insufficient documentation

## 2023-10-06 DIAGNOSIS — D168 Benign neoplasm of pelvic bones, sacrum and coccyx: Secondary | ICD-10-CM | POA: Diagnosis not present

## 2023-10-06 DIAGNOSIS — I10 Essential (primary) hypertension: Secondary | ICD-10-CM | POA: Insufficient documentation

## 2023-10-06 DIAGNOSIS — M899 Disorder of bone, unspecified: Secondary | ICD-10-CM | POA: Diagnosis not present

## 2023-10-06 DIAGNOSIS — E785 Hyperlipidemia, unspecified: Secondary | ICD-10-CM | POA: Diagnosis not present

## 2023-10-06 DIAGNOSIS — N400 Enlarged prostate without lower urinary tract symptoms: Secondary | ICD-10-CM | POA: Insufficient documentation

## 2023-10-06 LAB — CBC
HCT: 45.3 % (ref 39.0–52.0)
Hemoglobin: 15.4 g/dL (ref 13.0–17.0)
MCH: 30.4 pg (ref 26.0–34.0)
MCHC: 34 g/dL (ref 30.0–36.0)
MCV: 89.5 fL (ref 80.0–100.0)
Platelets: 221 10*3/uL (ref 150–400)
RBC: 5.06 MIL/uL (ref 4.22–5.81)
RDW: 13.2 % (ref 11.5–15.5)
WBC: 6.3 10*3/uL (ref 4.0–10.5)
nRBC: 0 % (ref 0.0–0.2)

## 2023-10-06 LAB — PROTIME-INR
INR: 1 (ref 0.8–1.2)
Prothrombin Time: 13.4 s (ref 11.4–15.2)

## 2023-10-06 MED ORDER — LIDOCAINE HCL 1 % IJ SOLN
10.0000 mL | Freq: Once | INTRAMUSCULAR | Status: AC
Start: 2023-10-06 — End: 2023-10-06
  Administered 2023-10-06: 10 mL via INTRADERMAL

## 2023-10-06 MED ORDER — MIDAZOLAM HCL 2 MG/2ML IJ SOLN
INTRAMUSCULAR | Status: AC
Start: 2023-10-06 — End: ?
  Filled 2023-10-06: qty 4

## 2023-10-06 MED ORDER — MIDAZOLAM HCL 2 MG/2ML IJ SOLN
INTRAMUSCULAR | Status: AC | PRN
  Administered 2023-10-06 (×2): 1 mg via INTRAVENOUS
  Administered 2023-10-06: .5 mg via INTRAVENOUS

## 2023-10-06 MED ORDER — FENTANYL CITRATE (PF) 100 MCG/2ML IJ SOLN
INTRAMUSCULAR | Status: AC | PRN
  Administered 2023-10-06 (×2): 50 ug via INTRAVENOUS

## 2023-10-06 MED ORDER — FENTANYL CITRATE (PF) 100 MCG/2ML IJ SOLN
INTRAMUSCULAR | Status: AC
  Filled 2023-10-06: qty 4

## 2023-10-06 NOTE — Progress Notes (Signed)
Patient and wife was given discharge instructions. Both verbalized understanding. 

## 2023-10-06 NOTE — Procedures (Signed)
Interventional Radiology Procedure Note  Procedure: CT Guided Biopsy of sacral mass  Complications: None  Estimated Blood Loss: < 10 mL  Findings: 14 and 11 G core biopsy of sacral mass performed under CT guidance.  Four core samples obtained and sent to Pathology.  Jodi Marble. Fredia Sorrow, M.D Pager:  [phone]

## 2023-10-10 LAB — SURGICAL PATHOLOGY

## 2023-11-20 DIAGNOSIS — M533 Sacrococcygeal disorders, not elsewhere classified: Secondary | ICD-10-CM | POA: Diagnosis not present

## 2023-11-21 ENCOUNTER — Other Ambulatory Visit: Payer: Self-pay | Admitting: Neurosurgery

## 2023-11-21 DIAGNOSIS — M533 Sacrococcygeal disorders, not elsewhere classified: Secondary | ICD-10-CM

## 2023-12-04 DIAGNOSIS — M25551 Pain in right hip: Secondary | ICD-10-CM | POA: Diagnosis not present

## 2023-12-04 DIAGNOSIS — R294 Clicking hip: Secondary | ICD-10-CM | POA: Diagnosis not present

## 2023-12-04 DIAGNOSIS — M25552 Pain in left hip: Secondary | ICD-10-CM | POA: Diagnosis not present

## 2023-12-11 DIAGNOSIS — I10 Essential (primary) hypertension: Secondary | ICD-10-CM | POA: Diagnosis not present

## 2023-12-11 DIAGNOSIS — I272 Pulmonary hypertension, unspecified: Secondary | ICD-10-CM | POA: Diagnosis not present

## 2023-12-11 DIAGNOSIS — E785 Hyperlipidemia, unspecified: Secondary | ICD-10-CM | POA: Diagnosis not present

## 2023-12-11 DIAGNOSIS — N529 Male erectile dysfunction, unspecified: Secondary | ICD-10-CM | POA: Diagnosis not present

## 2023-12-11 DIAGNOSIS — M810 Age-related osteoporosis without current pathological fracture: Secondary | ICD-10-CM | POA: Diagnosis not present

## 2023-12-11 DIAGNOSIS — M25561 Pain in right knee: Secondary | ICD-10-CM | POA: Diagnosis not present

## 2023-12-11 DIAGNOSIS — N401 Enlarged prostate with lower urinary tract symptoms: Secondary | ICD-10-CM | POA: Diagnosis not present

## 2023-12-11 DIAGNOSIS — L409 Psoriasis, unspecified: Secondary | ICD-10-CM | POA: Diagnosis not present

## 2023-12-11 DIAGNOSIS — F419 Anxiety disorder, unspecified: Secondary | ICD-10-CM | POA: Diagnosis not present

## 2023-12-11 DIAGNOSIS — I2510 Atherosclerotic heart disease of native coronary artery without angina pectoris: Secondary | ICD-10-CM | POA: Diagnosis not present

## 2023-12-11 DIAGNOSIS — D169 Benign neoplasm of bone and articular cartilage, unspecified: Secondary | ICD-10-CM | POA: Diagnosis not present

## 2023-12-11 DIAGNOSIS — G4733 Obstructive sleep apnea (adult) (pediatric): Secondary | ICD-10-CM | POA: Diagnosis not present

## 2023-12-12 ENCOUNTER — Encounter: Payer: Self-pay | Admitting: Cardiology

## 2023-12-23 DIAGNOSIS — G4733 Obstructive sleep apnea (adult) (pediatric): Secondary | ICD-10-CM | POA: Diagnosis not present

## 2024-01-01 ENCOUNTER — Ambulatory Visit: Payer: Self-pay | Admitting: Cardiology

## 2024-01-01 NOTE — Progress Notes (Unsigned)
  Cardiology Office Note:  .   Date:  01/01/2024  ID:  Colin Garrett, DOB 03-Oct-1946, MRN [id number] PCP: Rodrigo Ran, MD  [facility] Providers Cardiologist:  Truett Mainland, MD PCP: Rodrigo Ran, MD  No chief complaint on file.    Colin Garrett is a 78 y.o. male with hypertension, hyperlipidemia, CAD    Discussed the use of AI scribe software for clinical note transcription with the patient, who gave verbal consent to proceed.  History of Present Illness       There were no vitals filed for this visit.    ROS      Studies Reviewed: .        *** Independently interpreted 09/2023: Chol ***, TG ***, HDL ***, LDL *** HbA1C ***% Hb 15.4 Cr *** ***  Risk Assessment/Calculations:   {Does this patient have ATRIAL FIBRILLATION?[phone]}    Physical Exam   VISIT DIAGNOSES: No diagnosis found.   Colin Garrett is a 78 y.o. male with hypertension, hyperlipidemia, CAD   *** CAD: Elevated coronary calcium score, but atypical chest pain symptoms less likely to be angina.  No ischemia on stress test (04/2020). Continue Aspirin 81 mg, lipitor 40 mg, Zetia 10 mg, metoprolol, ramipril, SL NTG prn ***   *** Hypertension: Suspected component of white coat hypertension. No changes made today.   *** Mixed hyperlipidemia: Excellent control on Lipitor 40 mg, Zetia 10 mg daily. Assessment and Plan    {Are you ordering a CV Procedure (e.g. stress test, cath, DCCV, TEE, etc)?   Press F2        :[id number]}    No orders of the defined types were placed in this encounter.    F/u in ***  Signed, Elder Negus, MD

## 2024-01-02 ENCOUNTER — Encounter: Payer: Self-pay | Admitting: Cardiology

## 2024-01-02 ENCOUNTER — Ambulatory Visit: Payer: Medicare Other | Attending: Internal Medicine | Admitting: Cardiology

## 2024-01-02 VITALS — BP 118/70 | HR 71 | Resp 16 | Ht 66.0 in | Wt 162.0 lb

## 2024-01-02 DIAGNOSIS — I10 Essential (primary) hypertension: Secondary | ICD-10-CM | POA: Insufficient documentation

## 2024-01-02 DIAGNOSIS — I2510 Atherosclerotic heart disease of native coronary artery without angina pectoris: Secondary | ICD-10-CM | POA: Insufficient documentation

## 2024-01-02 DIAGNOSIS — E782 Mixed hyperlipidemia: Secondary | ICD-10-CM | POA: Diagnosis not present

## 2024-01-02 DIAGNOSIS — R931 Abnormal findings on diagnostic imaging of heart and coronary circulation: Secondary | ICD-10-CM | POA: Diagnosis not present

## 2024-01-02 MED ORDER — EZETIMIBE 10 MG PO TABS: 10.0000 mg | ORAL_TABLET | Freq: Every day | ORAL | 3 refills | Status: AC

## 2024-01-02 MED ORDER — NITROGLYCERIN 0.4 MG SL SUBL: 0.4000 mg | SUBLINGUAL_TABLET | SUBLINGUAL | 3 refills | Status: AC | PRN

## 2024-01-02 MED ORDER — ATORVASTATIN CALCIUM 40 MG PO TABS: 40.0000 mg | ORAL_TABLET | Freq: Every day | ORAL | 3 refills | Status: AC

## 2024-01-02 MED ORDER — RAMIPRIL 5 MG PO CAPS: 5.0000 mg | ORAL_CAPSULE | Freq: Every day | ORAL | 3 refills | Status: AC

## 2024-01-02 MED ORDER — ASPIRIN 81 MG PO TBEC: 81.0000 mg | DELAYED_RELEASE_TABLET | Freq: Every day | ORAL | 3 refills | Status: AC

## 2024-01-02 NOTE — Patient Instructions (Signed)
 Medication Instructions:   STOP TAKING VIAGRA  *If you need a refill on your cardiac medications before your next appointment, please call your pharmacy*    Follow-Up:  AS NEEDED WITH DR. PATWARDHAN       1st Floor: - Lobby - Registration  - Pharmacy  - Lab - Cafe  2nd Floor: - PV Lab - Diagnostic Testing (echo, CT, nuclear med)  3rd Floor: - Vacant  4th Floor: - TCTS (cardiothoracic surgery) - AFib Clinic - Structural Heart Clinic - Vascular Surgery  - Vascular Ultrasound  5th Floor: - [facility]Care Cardiology (general and EP) - Clinical Pharmacy for coumadin, hypertension, lipid, weight-loss medications, and med management appointments    Valet parking services will be available as well.

## 2024-04-05 ENCOUNTER — Other Ambulatory Visit: Payer: Medicare Other

## 2024-05-20 ENCOUNTER — Other Ambulatory Visit: Payer: Medicare Other
